# Patient Record
Sex: Female | Born: 1935 | Race: White | Hispanic: No | Marital: Married | Smoking: Never smoker
Health system: Southern US, Community
[De-identification: ages and names within clinical notes are randomized; demographics above are authoritative.]

## PROBLEM LIST (undated history)

## (undated) DIAGNOSIS — I10 Essential (primary) hypertension: Secondary | ICD-10-CM

## (undated) DIAGNOSIS — M1990 Unspecified osteoarthritis, unspecified site: Secondary | ICD-10-CM

---

## 1997-12-18 ENCOUNTER — Ambulatory Visit (HOSPITAL_COMMUNITY): Admission: RE | Admit: 1997-12-18 | Discharge: 1997-12-18 | Payer: Self-pay | Admitting: Family Medicine

## 1997-12-27 ENCOUNTER — Encounter: Payer: Self-pay | Admitting: Family Medicine

## 1997-12-27 ENCOUNTER — Ambulatory Visit (HOSPITAL_COMMUNITY): Admission: RE | Admit: 1997-12-27 | Discharge: 1997-12-27 | Payer: Self-pay | Admitting: Family Medicine

## 1999-01-09 ENCOUNTER — Ambulatory Visit (HOSPITAL_COMMUNITY): Admission: RE | Admit: 1999-01-09 | Discharge: 1999-01-09 | Payer: Self-pay | Admitting: Family Medicine

## 2000-02-26 ENCOUNTER — Ambulatory Visit (HOSPITAL_COMMUNITY): Admission: RE | Admit: 2000-02-26 | Discharge: 2000-02-26 | Payer: Self-pay | Admitting: Family Medicine

## 2000-03-08 ENCOUNTER — Encounter: Admission: RE | Admit: 2000-03-08 | Discharge: 2000-03-08 | Payer: Self-pay | Admitting: Family Medicine

## 2000-03-08 ENCOUNTER — Encounter: Payer: Self-pay | Admitting: Family Medicine

## 2000-11-25 ENCOUNTER — Encounter: Payer: Self-pay | Admitting: Family Medicine

## 2000-11-25 ENCOUNTER — Encounter: Admission: RE | Admit: 2000-11-25 | Discharge: 2000-11-25 | Payer: Self-pay | Admitting: Family Medicine

## 2001-03-01 ENCOUNTER — Ambulatory Visit (HOSPITAL_COMMUNITY): Admission: RE | Admit: 2001-03-01 | Discharge: 2001-03-01 | Payer: Self-pay | Admitting: Family Medicine

## 2001-04-04 ENCOUNTER — Encounter: Admission: RE | Admit: 2001-04-04 | Discharge: 2001-04-04 | Payer: Self-pay | Admitting: Family Medicine

## 2001-04-04 ENCOUNTER — Encounter: Payer: Self-pay | Admitting: Family Medicine

## 2002-03-02 ENCOUNTER — Ambulatory Visit (HOSPITAL_COMMUNITY): Admission: RE | Admit: 2002-03-02 | Discharge: 2002-03-02 | Payer: Self-pay | Admitting: Family Medicine

## 2002-03-02 ENCOUNTER — Encounter: Payer: Self-pay | Admitting: Family Medicine

## 2002-03-08 ENCOUNTER — Encounter: Admission: RE | Admit: 2002-03-08 | Discharge: 2002-04-06 | Payer: Self-pay | Admitting: Orthopaedic Surgery

## 2003-06-05 ENCOUNTER — Inpatient Hospital Stay (HOSPITAL_COMMUNITY): Admission: RE | Admit: 2003-06-05 | Discharge: 2003-06-08 | Payer: Self-pay | Admitting: Orthopedic Surgery

## 2003-06-12 ENCOUNTER — Ambulatory Visit (HOSPITAL_COMMUNITY): Admission: RE | Admit: 2003-06-12 | Discharge: 2003-06-12 | Payer: Self-pay | Admitting: Orthopedic Surgery

## 2004-01-21 ENCOUNTER — Encounter: Admission: RE | Admit: 2004-01-21 | Discharge: 2004-01-21 | Payer: Self-pay | Admitting: Family Medicine

## 2004-05-10 ENCOUNTER — Encounter: Admission: RE | Admit: 2004-05-10 | Discharge: 2004-05-10 | Payer: Self-pay | Admitting: Orthopaedic Surgery

## 2005-06-18 ENCOUNTER — Ambulatory Visit (HOSPITAL_COMMUNITY): Admission: RE | Admit: 2005-06-18 | Discharge: 2005-06-18 | Payer: Self-pay | Admitting: Family Medicine

## 2005-11-18 ENCOUNTER — Encounter: Admission: RE | Admit: 2005-11-18 | Discharge: 2005-11-18 | Payer: Self-pay | Admitting: Family Medicine

## 2008-10-16 ENCOUNTER — Encounter: Admission: RE | Admit: 2008-10-16 | Discharge: 2008-10-16 | Payer: Self-pay | Admitting: Obstetrics and Gynecology

## 2010-02-02 ENCOUNTER — Encounter: Payer: Self-pay | Admitting: Family Medicine

## 2010-05-30 NOTE — H&P (Signed)
Megan Harmon, Megan Harmon                       ACCOUNT NO.:  [id number]   MEDICAL RECORD NO.:  [id number]                   PATIENT TYPE:  INP   LOCATION:  0452                                 FACILITY:  [facility]   PHYSICIAN:  Madlyn Frankel. Charlann Boxer, M.D.               DATE OF BIRTH:  03/25/1932   DATE OF ADMISSION:  06/05/2003  DATE OF DISCHARGE:                                HISTORY & PHYSICAL   CHIEF COMPLAINT:  Left knee pain.   HISTORY OF PRESENT ILLNESS:  Ms. Megan Harmon is a pleasant 77 year old female  who had seen Dr. Charlann Boxer for evaluation of bilateral knee. She reports a long  standing history of arthritis involving multiple joints.  There was some  question of whether she had a diagnosis of rheumatoid arthritis; however,  she has never been treated medically for this.  She reported the recent  onset of increasing knee pain over the last two months, she noted swelling  behind her knee and increasing pain into the hip and to the ankle.  The  right knee though with constant pain has not increased in severity and is  tolerable. She complains of pain medially in the left knee and had some  popping and clicking type sensations. The pain in her knee has progressed to  the point where even though she has returned to work and tolerates activity  for a little while she ends of having enough pain that it causes her to be  tearful returning from work.  She has tried conservative treatment including  Celebrex, Aleve, Tylenol arthritis which does not provide a relief for her  knee whatsoever.   X-rays:  Radiographs in the office revealed significant end-stage medial  compartment degenerative changes in the left knee with joint space  preservation laterally with osteophyte formation both in the lateral  compartment and the patellofemoral compartment. Upon these findings, Dr.  Charlann Boxer feels it was best to proceed with a left total knee arthroplasty. The  patient agrees. The risks and benefits of the  surgery were discussed with  the patient and the patient wishes to proceed.   PAST MEDICAL HISTORY:  Hypertension and hypothyroidism.   PAST SURGICAL HISTORY:  Cholecystectomy, hysterectomy, bilateral feet  surgery, bilateral shoulder surgery and appendectomy.   MEDICATIONS:  1. Levothyroxine 0.2 mg 1 p.o. q.d.  2. Lotrel 10/20 mg 1 p.o. q.d.   ALLERGIES:  No known drug allergies.   SOCIAL HISTORY:  The patient denies any tobacco or alcohol use, lives in a  one story house with two steps entering the house, is married and her  grandson and her husband will be taking care of her after surgery.   FAMILY HISTORY:  Unremarkable.   REVIEW OF SYMPTOMS:  GENERAL:  Denies fevers, chills, night sweats, bleeding  tendencies. CNS:  Denies blurred or double vision, seizures, headaches,  paralysis.  RESPIRATORY:  Denies shortness of breath, productive cough,  hemoptysis.  CARDIOVASCULAR:  Denies chest pain, angina or orthopnea.  GI:  Positive consultation.  Denies nausea, vomiting, diarrhea, melena or bloody  stools. GU:  Positive frequency, positive nocturia.  Denies dysuria,  hematuria or discharge. MUSCULOSKELETAL:  Pertinent to HPI.   PHYSICAL EXAMINATION:  VITAL SIGNS:  Blood pressure 138/75, pulse 80,  respirations 16.  GENERAL:  Well-developed, well-nourished, 77 year old female.  HEENT:  Normocephalic, atraumatic. Pupils equal round and reactive to light.  NECK:  Supple, no carotid bruits.  CHEST:  Clear to auscultation bilaterally, no wheezes or crackles.  HEART:  Regular rate and rhythm, no murmurs, rubs or gallops.  ABDOMEN:  Soft, nontender, nondistended, positive bowel sounds x4.  EXTREMITIES:  The patient walks with an antalgic gait favoring the left  lower extremity.  Examination of the left knee reveals painful range of  motion with near full extension and flexion to 110 degrees with pain.  She  does have an obvious cyst with fluid collection and __________ on the left.   She is neurovascularly intact distally.  SKIN:  No rashes or lesions.   X-ray reveals end-stage osteoarthritis in the medial compartment and  osteophyte formation laterally and in the patellofemoral joint.   IMPRESSION:  1. Osteoarthritis left knee.  2. Hypertension.  3. Hypothyroidism.   PLAN:  The patient will be admitted to [facility] on Jun 05, 2003  and undergo a left total knee arthroplasty by Madlyn Frankel. Charlann Boxer, M.D.     Clarene Reamer, P.A.-C.                   Madlyn Frankel Charlann Boxer, M.D.    SW/MEDQ  D:  06/06/2003  T:  06/06/2003  Job:  [id number]

## 2010-05-30 NOTE — Op Note (Signed)
Megan Harmon, Megan Harmon                       ACCOUNT NO.:  [id number]   MEDICAL RECORD NO.:  [id number]                   PATIENT TYPE:  INP   LOCATION:  X004                                 FACILITY:  [facility]   PHYSICIAN:  Madlyn Frankel. Charlann Boxer, M.D.               DATE OF BIRTH:  03-12-1932   DATE OF PROCEDURE:  06/05/2003  DATE OF DISCHARGE:                                 OPERATIVE REPORT   PREOPERATIVE DIAGNOSES:  End-stage left knee osteoarthritis.   POSTOPERATIVE DIAGNOSES:  End-stage left knee osteoarthritis.   PROCEDURE:  Left total knee replacement.   COMPONENTS USED:  [facility] size 3 femur, size 3 cobalt  chromium polished tray with a size 15 poly, posterior stabilized 38 mm  patella.   SURGEON:  Madlyn Frankel. Charlann Boxer, M.D.   ASSISTANT:  Clarene Reamer, P.A.-C.   ANESTHESIA:  General.   ESTIMATED BLOOD LOSS:  150   TOURNIQUET TIME:  90 minutes at 300 mmHg.   COMPLICATIONS:  None apparent.   DRAINS:  Drains x1.   INDICATIONS FOR PROCEDURE:  Ms. Pucci is a 77 year old female who I have  been following for left knee degenerative changes both  clinically and  radiographically. She has failed conservative measures including injections  and oral antiinflammatories.  After reviewing the risks and benefits of left  total knee arthroplasty, she consents for the above procedure.   DESCRIPTION OF PROCEDURE:  The patient was brought to the operative theatre,  once adequate anesthesia and preoperative antibiotics were administered, the  patient was positioned supine with a proximal thigh tourniquet placed on the  left thigh. The left lower extremity was then prepped and draped in a  sterile fashion. A midline incision was made followed by medial parapatellar  arthrotomy.  Knee exposure revealed significant degenerative changes  tricompartmentally but most impressively medially. Following exposure of the  knee and brief __________osteophytes, the intramedullary guide  was placed on  the femur and 10 mm of bone were resected and found to be __________ across  the distal femur. At this point, a sizing jig was placed.  Initially it was  noted that the size 4 would prevent the notching of the anterior referencing  system.  With this size 4 in place, four size 4 anterior chamfer and  posterior chamfer cuts were made.  A trial femur was placed.  When the trial  femur was placed, it seemed to fit nicely on the medial side of the femur;  however, there seemed to be some lateral overhang.  I was a little worried  about the fact that femur seemed a little bit and I was going to keep this  in mind throughout the case. At this point, we directed attention to the  tibia.  Based on the cut surface of the tibia, I initially took 10 mm of  bone off of the medial side. This was done with an extramedullary device  passing through the medial third of the tibial tubercle.  After this cut, I  then evaluated an extension space and flexion space, I felt that we needed  to resect more bone. For this reason, I resected 4 more millimeters in order  to just prevent having to recut the tibia.  This would allow for further gap  in both extension and flexion symmetrically. With this made, the size 4  femur was placed followed by a 3 tibial size with a 10 mm poly and the knee  came to full hyperextension and we jumped up to a size 15 poly which gave [place]  excellent extension and stability. At this point, attention was directed to  patella preparation. The patella depth initially measured 22 mm. We resected  down to 13 mm and placed a 38 mm patella. At this point is when I first  noted the patella tracking seemed to be a little off. I was worried about  the lateral overhang of the femoral prosthesis.  At this point, I made a  decision to recut the femur into a size 3. Based on the position of the  initial pinning, what this basically amounted to was resecting probably 1  more millimeter  of bone anteriorly at 1 mm posterior and not changing  anything else.  The size 3 cutting block was placed and pinned into  position. Based on the concern for notching, I used the nonslotted version  noncaptured positioning so could feather the anterior cortex.  Following  this, I made the posterior cut and revisited the chamfer cuts. At this  point, a trial reduction was carried out to a size 3 femur, size 3 tibia and  a 50 mm polyp.  At this point, the knee came to full extension and was very  stable in extension and flexion, the patella now tracks.  Indicating that  the patella maltracking was a size issue.  Based on the anterior cortex I  was happy with this position.   At this point with final components known, we went ahead and finished  preparation of the tibia with a marked rotation with the knee in full  extension.  Basically the center portion of the tibial tray passed through  the medial third of the tibial tubercle.  Following tibial preparation  having the final components brought onto the field, the knee was copiously  irrigated with normal saline solution. The cement was prepared and once the  cement was prepared, the tibial component was cemented into position, a size  3 cobalt chromium polished tray followed by placement of a trial cruciate  retaining poly and the final size 3 tibial component was then cemented into  position. The knee was brought into full extension to allow the cement to  cure while the patella was cemented into position. Once the cement had  cured, excessive cement was removed. The knee was subluxated anteriorly  again to allow for debridement of any further cement plus placing the final  15 mm posterior stabilized poly ethylene liner. This polyethylene liner was  placed and the knee reduced. The knee was very stable in extension and flexion with good patellar tracking without thumb pressure.  At this point,  the knee was copiously irrigated with normal  saline solution. The tourniquet  was let down, the extensor mechanism was reapproximated using #1 PDS.  The  remainder of the wound was closed in layers with a running 4-0 Monocryl. The  knee was then cleaned, dried and  dressed sterilely with Steri-Strips,  dressings, sponges and bulky Jones dressing.  The patient tolerated the  procedure well without complications and was extubated and transferred to  the recovery room.                                               Madlyn Frankel Charlann Boxer, M.D.    MDO/MEDQ  D:  06/05/2003  T:  06/05/2003  Job:  [id number]

## 2010-05-30 NOTE — Discharge Summary (Signed)
[name], Megan Harmon                       ACCOUNT NO.:  [id number]   MEDICAL RECORD NO.:  [id number]                   PATIENT TYPE:  INP   LOCATION:  0452                                 FACILITY:  [facility]   PHYSICIAN:  Madlyn Frankel. Charlann Boxer, M.D.               DATE OF BIRTH:  [birth year]   DATE OF ADMISSION:  06/05/2003  DATE OF DISCHARGE:  06/08/2003                                 DISCHARGE SUMMARY   ADMISSION DIAGNOSES:  1. Osteoarthritis of the left knee.  2. Hypertension.  3. Hypothyroidism.   DISCHARGE DIAGNOSES:  1. Osteoarthritis of the left knee, status post left total knee     arthroplasty.  2. Hypertension.  3. Hypothyroidism.  4. Postoperative hemorrhagic anemia, stable at the time of discharge.   PROCEDURE:  The patient was taken to the operating room on Jun 05, 2003 and  underwent a left total knee arthroplasty.  Surgeon was Dr. Durene Romans and  assistant was Clarene Reamer, PA-C.  The surgery was done under general  anesthesia.  A Hemovac drain x1 was placed at the time of surgery.   CONSULTS:  1. Physical therapy.  2. Occupational therapy.  3. Social work.  4. Case management.   BRIEF HISTORY:  Patient is a pleasant 77 year old female who has seen Dr.  Charlann Boxer for evaluation of her bilateral knees.  She has a longstanding history  of arthritis involving multiple joints.  She has a recent onset of  increasing knee pain over the last two months.  She noted swelling behind  her knee and increasing pain into the hip and into the ankle.  The right  knee, although with constant pain, has not increased with severity and is  tolerable.  She complains of pain medially in the left knee and has some  popping and clicking-type sensations.  The pain in her knee has progressed  to the point where even though she has returned to work and is tolerating  activities for a little while, she ends up having enough pain that causes  her to be careful returning from work.  She has  tried conservative  management, including Celebrex, Aleve, Tylenol Arthritis, but this does not  provide relief of her knee whatsoever.   Radiographs in the office revealed significant end-stage medial compartment  degenerative changes in the left knee with joint space preservation  laterally with osteophyte formation in both the lateral compartment and  patellofemoral joint.  Upon these findings, Dr. Charlann Boxer felt it was best to  proceed with a left total knee arthroplasty.  The risks and benefits of the  surgery were discussed with the patient, and the patient wished to proceed.   LABORATORY DATA:  A CBC on admission showed a hemoglobin of 13.4, hematocrit  39.9, white blood cell count 18.5, red blood cell count 4.320.  H&H was  performed throughout the hospital stay.  Hemoglobin and hematocrit did  decline to 10.2  and 30.7 on May 26; however, on May 27 at the time of  discharge, they were stable at 10.3 and 30.6.  Differential on admission was  within normal limits.  Coagulation studies on admission were all within  normal limits.  PT/INR at the time of discharge were 15.3 and 1.3  respectively on Coumadin therapy.   Routine chemistry on admission showed the glucose high at 109 and BUN high  at 25.  Serial chemistries were followed throughout the hospital stay.  The  glucose ranged from a low of 105 on May 27, the day of discharge, to a high  of 138 on May 26.   BUN fell to 5 on May 26 and returned to normal at 7 on May 27.  Urinalysis  on admission showed a cloudy appearance with trace leukocyte esterase.  The  follow-up urinalysis showed a small amount of leukocyte esterase on May 24.  Patient's blood type is B positive with antibody screen negative.   EKG at the time of admission showed a normal sinus rhythm with low voltage,  QRS, and borderline EKG.   Preop chest x-ray revealed no acute cardiopulmonary findings.   HOSPITAL COURSE:  The patient was admitted to [facility] on Jun 05, 2003 and taken to the operating room.  She underwent the above-stated  procedure without complication.  The patient tolerated the procedure well  and was allowed to return to the recovery room and then to the orthopedic  floor to obtain postoperative care.  A Hemovac drain placed at the time of  surgery was discontinued on postop day #1.  The patient progressed with  physical therapy.  On postop day #2, the patient was weaned from PCA  analgesia to p.o. pain medicine.  She progressed well with physical therapy,  meeting all physical therapy goals by postop day #3, and was ready for  discharge on Jun 08, 2003, being postoperative day  #3.   DISPOSITION:  The patient was discharged home on Jun 08, 2003.   DISCHARGE MEDICATIONS:  1. Vicodin 1-2 p.o. q.4-6h. p.r.n. pain.  2. Robaxin 500 mg 1 p.o. q.6-8h. p.r.n. spasm.  3. Coumadin 7.5 mg 1 p.o. at 6 p.m. daily.   DIET:  As tolerated.   ACTIVITY:  Patient is weightbearing as tolerated to the left lower  extremity.  [facility] for home care.   WOUND CARE:  Patient is to have daily dressing changes performed until no  drainage.  She may shower when no drainage.   FOLLOW UP:  Patient is to follow up with Dr. Charlann Boxer two weeks from the date of  surgery.  Call the office for an appointment at [phone].   CONDITION ON DISCHARGE:  Stable.     Clarene Reamer, P.A.-C.                   Madlyn Frankel Charlann Boxer, M.D.    SW/MEDQ  D:  07/03/2003  T:  07/04/2003  Job:  [id number]

## 2010-11-20 ENCOUNTER — Emergency Department (HOSPITAL_COMMUNITY): Payer: No Typology Code available for payment source

## 2010-11-20 ENCOUNTER — Emergency Department (HOSPITAL_COMMUNITY)
Admission: EM | Admit: 2010-11-20 | Discharge: 2010-11-20 | Disposition: A | Discharge status: Home / Self Care | Payer: No Typology Code available for payment source | Attending: Emergency Medicine | Admitting: Emergency Medicine

## 2010-11-20 DIAGNOSIS — R079 Chest pain, unspecified: Secondary | ICD-10-CM | POA: Insufficient documentation

## 2010-11-20 DIAGNOSIS — R109 Unspecified abdominal pain: Secondary | ICD-10-CM | POA: Insufficient documentation

## 2010-11-20 DIAGNOSIS — S0990XA Unspecified injury of head, initial encounter: Secondary | ICD-10-CM | POA: Insufficient documentation

## 2010-11-20 DIAGNOSIS — IMO0002 Reserved for concepts with insufficient information to code with codable children: Secondary | ICD-10-CM | POA: Insufficient documentation

## 2010-11-20 DIAGNOSIS — R51 Headache: Secondary | ICD-10-CM | POA: Insufficient documentation

## 2010-11-20 DIAGNOSIS — M549 Dorsalgia, unspecified: Secondary | ICD-10-CM | POA: Insufficient documentation

## 2010-11-20 DIAGNOSIS — M542 Cervicalgia: Secondary | ICD-10-CM | POA: Insufficient documentation

## 2010-11-20 DIAGNOSIS — I10 Essential (primary) hypertension: Secondary | ICD-10-CM | POA: Insufficient documentation

## 2010-11-20 DIAGNOSIS — T148XXA Other injury of unspecified body region, initial encounter: Secondary | ICD-10-CM | POA: Insufficient documentation

## 2010-11-20 HISTORY — DX: Essential (primary) hypertension: I10

## 2010-11-20 HISTORY — DX: Unspecified osteoarthritis, unspecified site: M19.90

## 2010-11-20 LAB — CBC
HCT: 42.4 % (ref 36.0–46.0)
Hemoglobin: 14.2 g/dL (ref 12.0–15.0)
MCH: 31.6 pg (ref 26.0–34.0)
MCHC: 33.5 g/dL (ref 30.0–36.0)
MCV: 94.4 fL (ref 78.0–100.0)
Platelets: 244 10*3/uL (ref 150–400)
RBC: 4.49 MIL/uL (ref 3.87–5.11)
RDW: 13.8 % (ref 11.5–15.5)
WBC: 8.1 10*3/uL (ref 4.0–10.5)

## 2010-11-20 LAB — BASIC METABOLIC PANEL
BUN: 11 mg/dL (ref 6–23)
CO2: 29 mEq/L (ref 19–32)
Calcium: 10.5 mg/dL (ref 8.4–10.5)
Chloride: 104 mEq/L (ref 96–112)
Creatinine, Ser: 0.73 mg/dL (ref 0.50–1.10)
GFR calc Af Amer: >90 mL/min (ref >90)
GFR calc non Af Amer: 81 mL/min — ABNORMAL LOW (ref >90)
Glucose, Bld: 124 mg/dL — ABNORMAL HIGH (ref 70–99)
Potassium: 5.1 mEq/L (ref 3.5–5.1)
Sodium: 144 mEq/L (ref 135–145)

## 2010-11-20 MED ORDER — MORPHINE SULFATE 4 MG/ML IJ SOLN
4.0000 mg | Freq: Once | INTRAMUSCULAR | Status: AC
  Administered 2010-11-20: 4 mg via INTRAVENOUS
  Filled 2010-11-20: qty 1

## 2010-11-20 MED ORDER — LORAZEPAM 2 MG/ML IJ SOLN
1.0000 mg | Freq: Once | INTRAMUSCULAR | Status: AC
  Administered 2010-11-20: 1 mg via INTRAVENOUS
  Filled 2010-11-20: qty 1

## 2010-11-20 MED ORDER — IOHEXOL 300 MG/ML  SOLN: 80.0000 mL | Freq: Once | INTRAMUSCULAR | Status: DC | PRN

## 2010-11-20 MED ORDER — SODIUM CHLORIDE 0.9 % IV BOLUS (SEPSIS)
500.0000 mL | Freq: Once | INTRAVENOUS | Status: AC
  Administered 2010-11-20: 500 mL via INTRAVENOUS
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
## 2010-11-20 NOTE — Progress Notes (Signed)
Spiritual Care   Visited with patient and provided emotional support to patient.     Thornell Sartorius, chaplain

## 2010-11-20 NOTE — ED Notes (Signed)
Transferred to Ct scan
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
## 2013-04-09 ENCOUNTER — Emergency Department (HOSPITAL_COMMUNITY): Payer: Medicare Other

## 2013-04-09 ENCOUNTER — Encounter (HOSPITAL_COMMUNITY): Payer: Self-pay | Admitting: Emergency Medicine

## 2013-04-09 ENCOUNTER — Emergency Department (HOSPITAL_COMMUNITY)
Admission: EM | Admit: 2013-04-09 | Discharge: 2013-04-09 | Disposition: A | Discharge status: Home / Self Care | Payer: Medicare Other | Attending: Emergency Medicine | Admitting: Emergency Medicine

## 2013-04-09 DIAGNOSIS — Y929 Unspecified place or not applicable: Secondary | ICD-10-CM | POA: Insufficient documentation

## 2013-04-09 DIAGNOSIS — Y9389 Activity, other specified: Secondary | ICD-10-CM | POA: Insufficient documentation

## 2013-04-09 DIAGNOSIS — IMO0002 Reserved for concepts with insufficient information to code with codable children: Secondary | ICD-10-CM | POA: Insufficient documentation

## 2013-04-09 DIAGNOSIS — I10 Essential (primary) hypertension: Secondary | ICD-10-CM | POA: Insufficient documentation

## 2013-04-09 DIAGNOSIS — Z79899 Other long term (current) drug therapy: Secondary | ICD-10-CM | POA: Insufficient documentation

## 2013-04-09 DIAGNOSIS — S63502A Unspecified sprain of left wrist, initial encounter: Secondary | ICD-10-CM

## 2013-04-09 DIAGNOSIS — S63509A Unspecified sprain of unspecified wrist, initial encounter: Secondary | ICD-10-CM | POA: Insufficient documentation

## 2013-04-09 DIAGNOSIS — M129 Arthropathy, unspecified: Secondary | ICD-10-CM | POA: Insufficient documentation
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
## 2013-04-09 NOTE — Discharge Instructions (Signed)
Take your vicodin as prescribed for severe pain.   Do not drive within four hours of taking this medication (may cause drowsiness or confusion).  Take low dose ibuprofen or aleve as well. Ice 3 times a day for 15-20 minutes.  Elevate when possible to decrease swelling and pain.  Follow up with Dr. Mina MarbleWeingold for recheck.  Return to the ER if your pain worsens or you have any other concerns.

## 2013-04-09 NOTE — Progress Notes (Signed)
Orthopedic Tech Progress Note Patient Details:  [facility]yl[facility] [place] Mar 19, 1932 [id number]  Ortho Devices Type of Ortho Device: Thumb velcro splint Ortho Device/Splint Location: lue Ortho Device/Splint Interventions: Application   Astoria Condon 04/09/2013, 7:16 PM

## 2013-04-09 NOTE — ED Notes (Signed)
Thumb spica is already placed. Will prepare for discharge
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
## 2013-04-12 NOTE — ED Provider Notes (Signed)
Medical screening examination/treatment/procedure(s) were performed by non-physician practitioner and as supervising physician I was immediately available for consultation/collaboration.     Zylan Almquist, MD 04/12/13 2354 

## 2015-10-28 ENCOUNTER — Ambulatory Visit
Admission: RE | Admit: 2015-10-28 | Discharge: 2015-10-28 | Disposition: A | Discharge status: Home / Self Care | Payer: Self-pay | Source: Ambulatory Visit | Attending: Physician Assistant | Admitting: Physician Assistant

## 2015-10-28 ENCOUNTER — Other Ambulatory Visit: Payer: Self-pay | Admitting: Physician Assistant

## 2015-10-28 DIAGNOSIS — R109 Unspecified abdominal pain: Secondary | ICD-10-CM

## 2016-04-01 ENCOUNTER — Ambulatory Visit
Admission: RE | Admit: 2016-04-01 | Discharge: 2016-04-01 | Disposition: A | Discharge status: Home / Self Care | Payer: Medicare Other | Source: Ambulatory Visit | Attending: Family Medicine | Admitting: Family Medicine

## 2016-04-01 ENCOUNTER — Other Ambulatory Visit: Payer: Self-pay | Admitting: Family Medicine

## 2016-04-01 DIAGNOSIS — R52 Pain, unspecified: Secondary | ICD-10-CM

## 2016-04-01 DIAGNOSIS — R0789 Other chest pain: Secondary | ICD-10-CM

## 2016-04-02 ENCOUNTER — Other Ambulatory Visit: Payer: Self-pay | Admitting: Family Medicine

## 2016-04-02 DIAGNOSIS — R51 Headache: Secondary | ICD-10-CM

## 2016-04-02 DIAGNOSIS — R42 Dizziness and giddiness: Secondary | ICD-10-CM

## 2016-04-02 DIAGNOSIS — S0990XA Unspecified injury of head, initial encounter: Secondary | ICD-10-CM

## 2016-04-02 DIAGNOSIS — R519 Headache, unspecified: Secondary | ICD-10-CM

## 2016-04-07 ENCOUNTER — Other Ambulatory Visit: Payer: Medicare Other

## 2016-04-09 ENCOUNTER — Ambulatory Visit
Admission: RE | Admit: 2016-04-09 | Discharge: 2016-04-09 | Disposition: A | Discharge status: Home / Self Care | Payer: Medicare Other | Source: Ambulatory Visit | Attending: Family Medicine | Admitting: Family Medicine

## 2016-04-09 DIAGNOSIS — R42 Dizziness and giddiness: Secondary | ICD-10-CM

## 2016-04-09 DIAGNOSIS — R51 Headache: Secondary | ICD-10-CM

## 2016-04-09 DIAGNOSIS — S0990XA Unspecified injury of head, initial encounter: Secondary | ICD-10-CM

## 2016-04-09 DIAGNOSIS — R519 Headache, unspecified: Secondary | ICD-10-CM

## 2017-09-17 ENCOUNTER — Ambulatory Visit
Admission: RE | Admit: 2017-09-17 | Discharge: 2017-09-17 | Disposition: A | Discharge status: Home / Self Care | Payer: Medicare Other | Source: Ambulatory Visit | Attending: Family Medicine | Admitting: Family Medicine

## 2017-09-17 ENCOUNTER — Other Ambulatory Visit: Payer: Self-pay | Admitting: Family Medicine

## 2017-09-17 DIAGNOSIS — S0990XA Unspecified injury of head, initial encounter: Secondary | ICD-10-CM
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
## 2018-01-12 IMAGING — US US CAROTID DUPLEX BILAT
1 series · 13 of 24 positions shown · non-contrast
Comparison: None.

CLINICAL DATA: Dizziness.  Two episodes of syncope this year.

EXAM:
BILATERAL CAROTID DUPLEX ULTRASOUND
TECHNIQUE: Gray scale imaging, color Doppler and duplex ultrasound were
performed of bilateral carotid and vertebral arteries in the neck.

[Series 1: us carotid duplex bilat · 0.06mm/px · 13 of 45 slices shown]
[im 1/45]
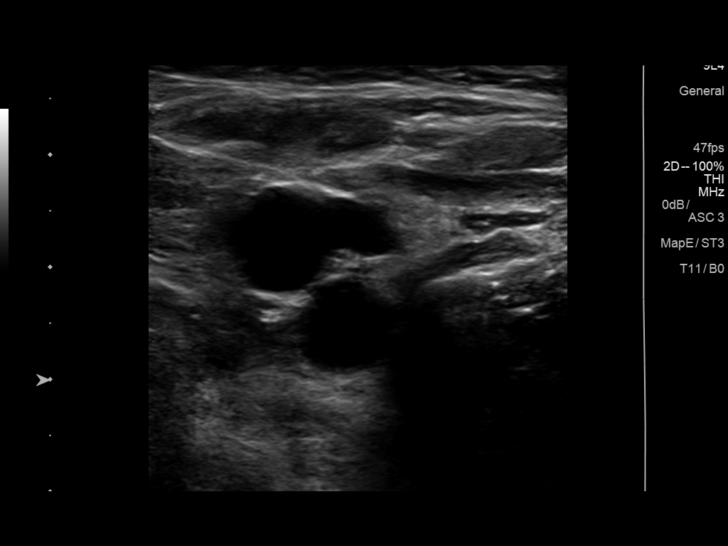
[im 4/45]
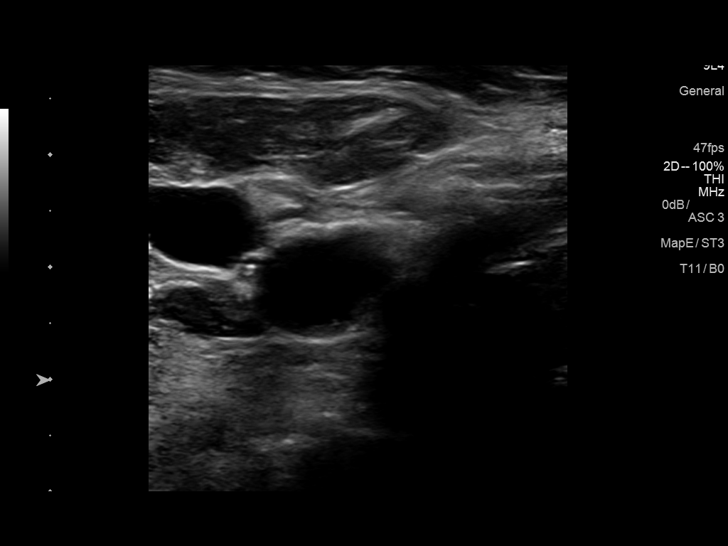
[im 8/45]
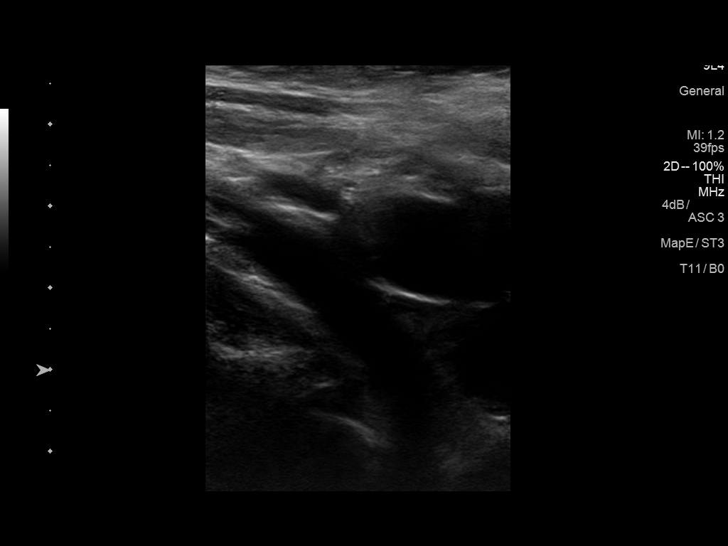
[im 12/45]
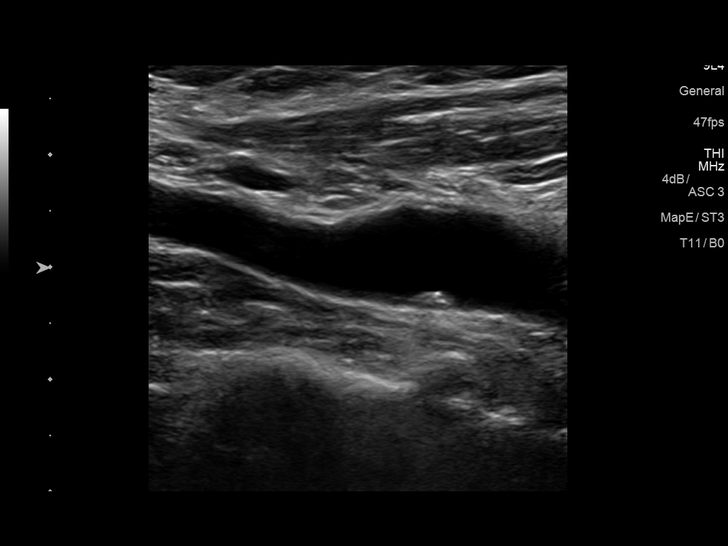
[im 16/45]
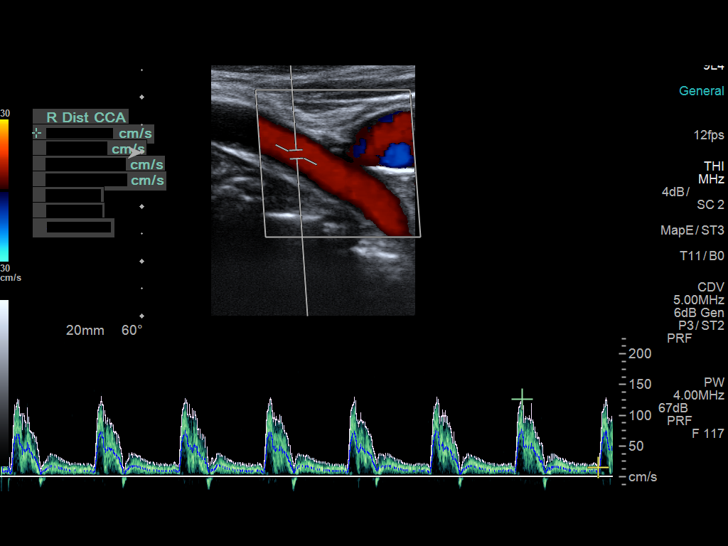
[im 20/45]
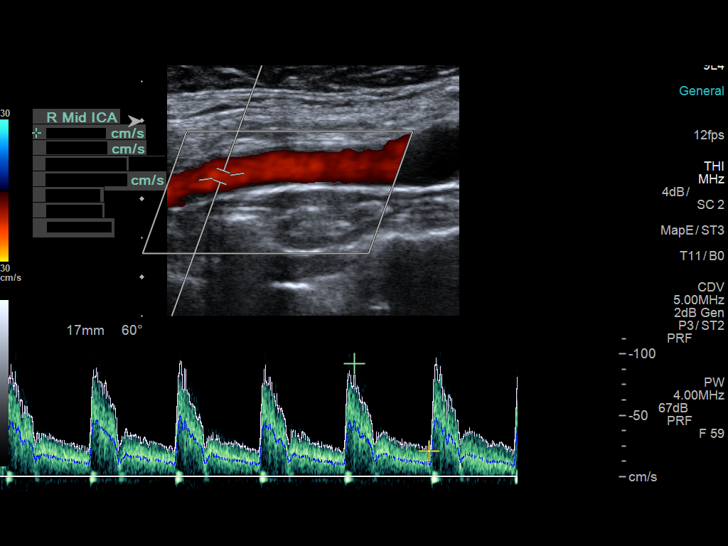
[im 23/45]
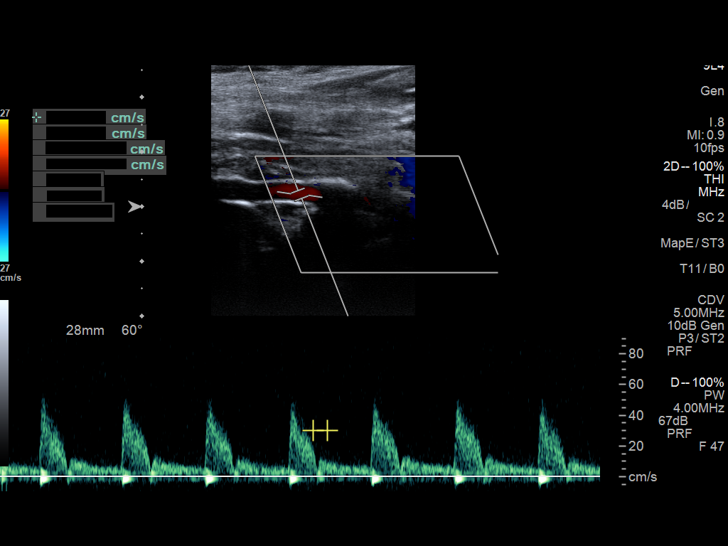
[im 25/45]
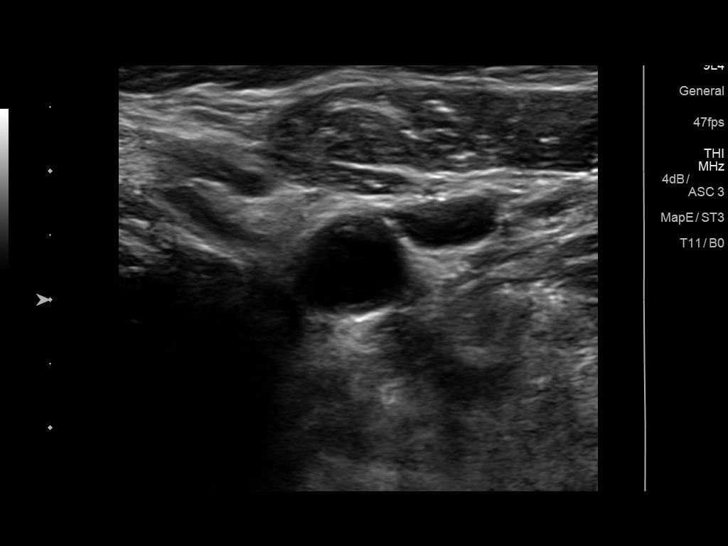
[im 29/45]
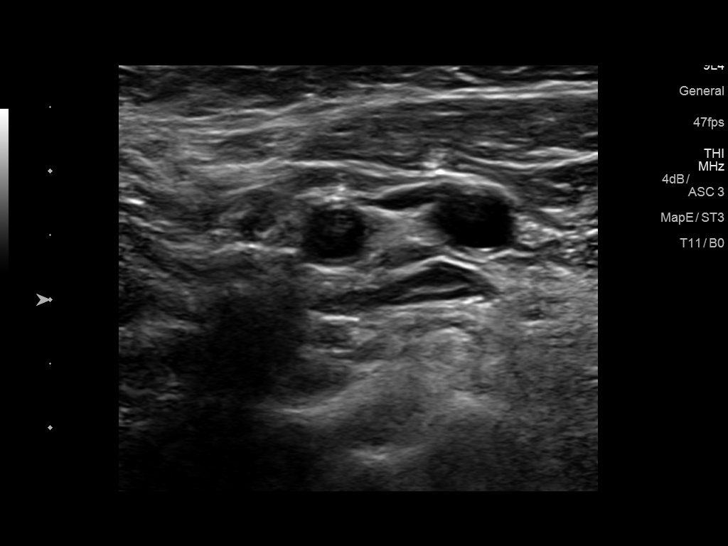
[im 33/45]
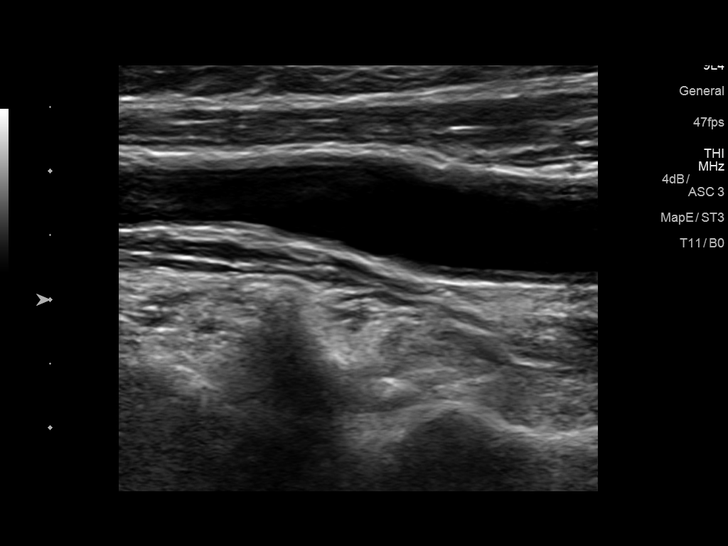
[im 37/45]
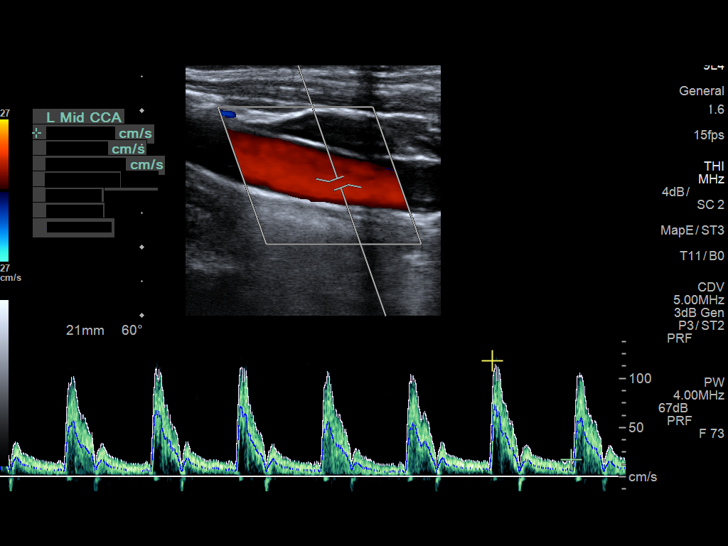
[im 41/45]
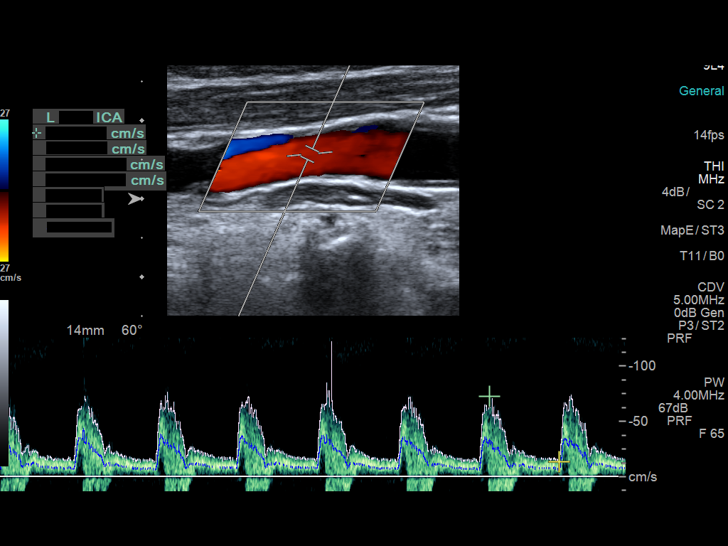
[im 45/45]
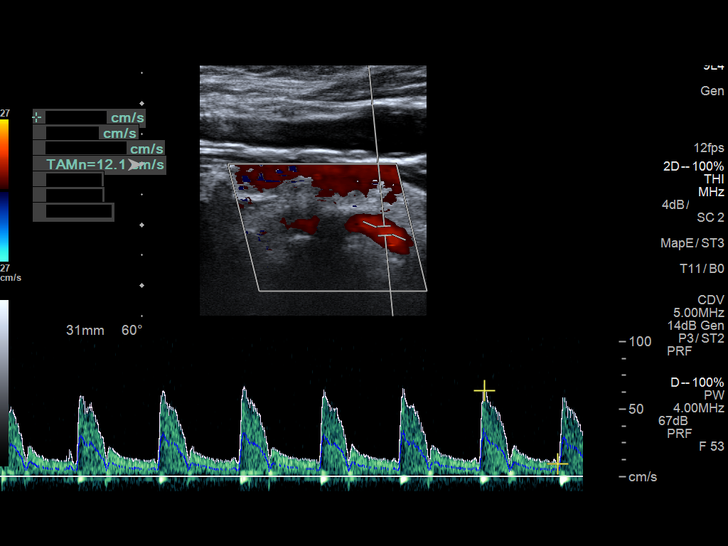

[13 of 24 positions shown; findings below may reference images not displayed]

FINDINGS: Criteria: Quantification of carotid stenosis is based on velocity
parameters that correlate the residual internal carotid diameter
with NASCET-based stenosis levels, using the diameter of the distal
internal carotid lumen as the denominator for stenosis measurement.

The following velocity measurements were obtained:

RIGHT

ICA:  109 cm/sec

CCA:  126 cm/sec

SYSTOLIC ICA/CCA RATIO:

DIASTOLIC ICA/CCA RATIO:

ECA:  115 cm/sec

LEFT

ICA:  100 cm/sec

CCA:  128 cm/sec

SYSTOLIC ICA/CCA RATIO:

DIASTOLIC ICA/CCA RATIO:

ECA:  150 cm/sec

RIGHT CAROTID ARTERY: Minimal calcified plaque in the bulb. Low
resistance internal carotid Doppler pattern.

RIGHT VERTEBRAL ARTERY:  Antegrade.

LEFT CAROTID ARTERY: Little if any plaque in the bulb. Low
resistance internal carotid Doppler pattern.

LEFT VERTEBRAL ARTERY:  Antegrade.
IMPRESSION: Less than 50% stenosis in the right and left internal carotid
arteries.

## 2018-10-10 ENCOUNTER — Ambulatory Visit
Admission: RE | Admit: 2018-10-10 | Discharge: 2018-10-10 | Disposition: A | Discharge status: Home / Self Care | Payer: Medicare Other | Source: Ambulatory Visit | Attending: Family Medicine | Admitting: Family Medicine

## 2018-10-10 ENCOUNTER — Other Ambulatory Visit: Payer: Self-pay | Admitting: Family Medicine

## 2018-10-10 DIAGNOSIS — M79671 Pain in right foot: Secondary | ICD-10-CM

## 2018-10-10 DIAGNOSIS — M25551 Pain in right hip: Secondary | ICD-10-CM

## 2020-01-22 ENCOUNTER — Other Ambulatory Visit: Payer: Self-pay | Admitting: Physician Assistant

## 2020-01-22 ENCOUNTER — Ambulatory Visit
Admission: RE | Admit: 2020-01-22 | Discharge: 2020-01-22 | Disposition: A | Discharge status: Home / Self Care | Payer: Medicare Other | Source: Ambulatory Visit | Attending: Physician Assistant | Admitting: Physician Assistant

## 2020-01-22 DIAGNOSIS — R0602 Shortness of breath: Secondary | ICD-10-CM

## 2020-01-22 DIAGNOSIS — R079 Chest pain, unspecified: Secondary | ICD-10-CM | POA: Diagnosis not present

## 2020-01-22 DIAGNOSIS — M1990 Unspecified osteoarthritis, unspecified site: Secondary | ICD-10-CM | POA: Diagnosis not present

## 2020-01-22 DIAGNOSIS — R52 Pain, unspecified: Secondary | ICD-10-CM

## 2020-01-22 DIAGNOSIS — M25512 Pain in left shoulder: Secondary | ICD-10-CM | POA: Diagnosis not present

## 2020-01-22 DIAGNOSIS — M19012 Primary osteoarthritis, left shoulder: Secondary | ICD-10-CM | POA: Diagnosis not present

## 2020-02-20 DIAGNOSIS — Z961 Presence of intraocular lens: Secondary | ICD-10-CM | POA: Diagnosis not present

## 2020-02-20 DIAGNOSIS — H40051 Ocular hypertension, right eye: Secondary | ICD-10-CM | POA: Diagnosis not present

## 2020-02-20 DIAGNOSIS — H04123 Dry eye syndrome of bilateral lacrimal glands: Secondary | ICD-10-CM | POA: Diagnosis not present

## 2020-02-20 DIAGNOSIS — H401123 Primary open-angle glaucoma, left eye, severe stage: Secondary | ICD-10-CM | POA: Diagnosis not present

## 2020-02-22 DIAGNOSIS — R0602 Shortness of breath: Secondary | ICD-10-CM | POA: Diagnosis not present

## 2020-02-22 DIAGNOSIS — E039 Hypothyroidism, unspecified: Secondary | ICD-10-CM | POA: Diagnosis not present

## 2020-03-14 DIAGNOSIS — M899 Disorder of bone, unspecified: Secondary | ICD-10-CM | POA: Diagnosis not present

## 2020-03-14 DIAGNOSIS — R7301 Impaired fasting glucose: Secondary | ICD-10-CM | POA: Diagnosis not present

## 2020-03-14 DIAGNOSIS — E039 Hypothyroidism, unspecified: Secondary | ICD-10-CM | POA: Diagnosis not present

## 2020-03-14 DIAGNOSIS — K219 Gastro-esophageal reflux disease without esophagitis: Secondary | ICD-10-CM | POA: Diagnosis not present

## 2020-03-14 DIAGNOSIS — H409 Unspecified glaucoma: Secondary | ICD-10-CM | POA: Diagnosis not present

## 2020-03-14 DIAGNOSIS — E782 Mixed hyperlipidemia: Secondary | ICD-10-CM | POA: Diagnosis not present

## 2020-03-14 DIAGNOSIS — I10 Essential (primary) hypertension: Secondary | ICD-10-CM | POA: Diagnosis not present

## 2020-03-14 DIAGNOSIS — M1990 Unspecified osteoarthritis, unspecified site: Secondary | ICD-10-CM | POA: Diagnosis not present

## 2020-03-14 DIAGNOSIS — Z0000 Encounter for general adult medical examination without abnormal findings: Secondary | ICD-10-CM | POA: Diagnosis not present

## 2020-04-18 DIAGNOSIS — H401123 Primary open-angle glaucoma, left eye, severe stage: Secondary | ICD-10-CM | POA: Diagnosis not present

## 2020-04-18 DIAGNOSIS — H40051 Ocular hypertension, right eye: Secondary | ICD-10-CM | POA: Diagnosis not present

## 2020-09-19 DIAGNOSIS — E039 Hypothyroidism, unspecified: Secondary | ICD-10-CM | POA: Diagnosis not present

## 2020-09-19 DIAGNOSIS — R7301 Impaired fasting glucose: Secondary | ICD-10-CM | POA: Diagnosis not present

## 2020-09-19 DIAGNOSIS — M1990 Unspecified osteoarthritis, unspecified site: Secondary | ICD-10-CM | POA: Diagnosis not present

## 2020-09-19 DIAGNOSIS — E782 Mixed hyperlipidemia: Secondary | ICD-10-CM | POA: Diagnosis not present

## 2020-09-19 DIAGNOSIS — I10 Essential (primary) hypertension: Secondary | ICD-10-CM | POA: Diagnosis not present

## 2020-09-19 DIAGNOSIS — Z23 Encounter for immunization: Secondary | ICD-10-CM | POA: Diagnosis not present

## 2021-01-29 DIAGNOSIS — H6123 Impacted cerumen, bilateral: Secondary | ICD-10-CM | POA: Diagnosis not present

## 2021-02-25 DIAGNOSIS — H40051 Ocular hypertension, right eye: Secondary | ICD-10-CM | POA: Diagnosis not present

## 2021-02-25 DIAGNOSIS — H401123 Primary open-angle glaucoma, left eye, severe stage: Secondary | ICD-10-CM | POA: Diagnosis not present

## 2021-02-25 DIAGNOSIS — Z961 Presence of intraocular lens: Secondary | ICD-10-CM | POA: Diagnosis not present

## 2021-02-25 DIAGNOSIS — H04123 Dry eye syndrome of bilateral lacrimal glands: Secondary | ICD-10-CM | POA: Diagnosis not present

## 2021-03-21 DIAGNOSIS — Z0000 Encounter for general adult medical examination without abnormal findings: Secondary | ICD-10-CM | POA: Diagnosis not present

## 2021-03-21 DIAGNOSIS — E039 Hypothyroidism, unspecified: Secondary | ICD-10-CM | POA: Diagnosis not present

## 2021-03-21 DIAGNOSIS — M899 Disorder of bone, unspecified: Secondary | ICD-10-CM | POA: Diagnosis not present

## 2021-03-21 DIAGNOSIS — K219 Gastro-esophageal reflux disease without esophagitis: Secondary | ICD-10-CM | POA: Diagnosis not present

## 2021-03-21 DIAGNOSIS — E782 Mixed hyperlipidemia: Secondary | ICD-10-CM | POA: Diagnosis not present

## 2021-03-21 DIAGNOSIS — M1990 Unspecified osteoarthritis, unspecified site: Secondary | ICD-10-CM | POA: Diagnosis not present

## 2021-03-21 DIAGNOSIS — I10 Essential (primary) hypertension: Secondary | ICD-10-CM | POA: Diagnosis not present

## 2021-03-21 DIAGNOSIS — H409 Unspecified glaucoma: Secondary | ICD-10-CM | POA: Diagnosis not present

## 2021-03-21 DIAGNOSIS — R7301 Impaired fasting glucose: Secondary | ICD-10-CM | POA: Diagnosis not present

## 2021-05-28 DIAGNOSIS — H04123 Dry eye syndrome of bilateral lacrimal glands: Secondary | ICD-10-CM | POA: Diagnosis not present

## 2021-05-28 DIAGNOSIS — Z961 Presence of intraocular lens: Secondary | ICD-10-CM | POA: Diagnosis not present

## 2021-05-28 DIAGNOSIS — H401123 Primary open-angle glaucoma, left eye, severe stage: Secondary | ICD-10-CM | POA: Diagnosis not present

## 2021-09-18 DIAGNOSIS — E039 Hypothyroidism, unspecified: Secondary | ICD-10-CM | POA: Diagnosis not present

## 2021-09-18 DIAGNOSIS — I10 Essential (primary) hypertension: Secondary | ICD-10-CM | POA: Diagnosis not present

## 2021-09-18 DIAGNOSIS — M1990 Unspecified osteoarthritis, unspecified site: Secondary | ICD-10-CM | POA: Diagnosis not present

## 2021-09-18 DIAGNOSIS — R7301 Impaired fasting glucose: Secondary | ICD-10-CM | POA: Diagnosis not present

## 2021-09-18 DIAGNOSIS — E782 Mixed hyperlipidemia: Secondary | ICD-10-CM | POA: Diagnosis not present

## 2021-09-18 DIAGNOSIS — Z23 Encounter for immunization: Secondary | ICD-10-CM | POA: Diagnosis not present

## 2021-09-18 DIAGNOSIS — R1013 Epigastric pain: Secondary | ICD-10-CM | POA: Diagnosis not present

## 2021-10-11 DIAGNOSIS — Z23 Encounter for immunization: Secondary | ICD-10-CM | POA: Diagnosis not present

## 2022-01-08 ENCOUNTER — Other Ambulatory Visit (HOSPITAL_COMMUNITY): Payer: Self-pay

## 2022-01-13 ENCOUNTER — Other Ambulatory Visit (HOSPITAL_COMMUNITY): Payer: Self-pay

## 2022-01-13 ENCOUNTER — Other Ambulatory Visit: Payer: Self-pay

## 2022-01-20 ENCOUNTER — Other Ambulatory Visit (HOSPITAL_COMMUNITY): Payer: Self-pay

## 2022-01-20 MED ORDER — SIMVASTATIN 20 MG PO TABS
20.0000 mg | ORAL_TABLET | Freq: Every evening | ORAL | 0 refills | Status: DC
  Filled 2022-01-20: qty 90, 90d supply, fill #0

## 2022-01-20 MED ORDER — BENAZEPRIL HCL 40 MG PO TABS
40.0000 mg | ORAL_TABLET | Freq: Every day | ORAL | 0 refills | Status: DC
  Filled 2022-01-20: qty 90, 90d supply, fill #0

## 2022-01-20 MED ORDER — MELOXICAM 15 MG PO TABS
15.0000 mg | ORAL_TABLET | Freq: Every day | ORAL | 0 refills | Status: DC
  Filled 2022-01-20: qty 90, 90d supply, fill #0

## 2022-01-20 MED ORDER — ALPRAZOLAM 0.5 MG PO TABS
0.2500 mg | ORAL_TABLET | Freq: Two times a day (BID) | ORAL | 0 refills | Status: DC | PRN
  Filled 2022-01-20: qty 60, 30d supply, fill #0

## 2022-01-20 MED ORDER — HYDROCHLOROTHIAZIDE 25 MG PO TABS
25.0000 mg | ORAL_TABLET | ORAL | 0 refills | Status: DC
  Filled 2022-01-20: qty 90, 90d supply, fill #0

## 2022-01-20 MED ORDER — OMEPRAZOLE 40 MG PO CPDR
DELAYED_RELEASE_CAPSULE | ORAL | 2 refills | Status: DC
  Filled 2022-01-20: qty 60, 30d supply, fill #0
  Filled 2022-08-12: qty 60, 30d supply, fill #1
  Filled 2022-10-12: qty 60, 30d supply, fill #2

## 2022-01-20 MED ORDER — FUROSEMIDE 20 MG PO TABS
20.0000 mg | ORAL_TABLET | Freq: Every day | ORAL | 0 refills | Status: DC
  Filled 2022-01-20: qty 90, 90d supply, fill #0

## 2022-01-20 MED ORDER — LEVOTHYROXINE SODIUM 150 MCG PO TABS
150.0000 ug | ORAL_TABLET | ORAL | 3 refills | Status: AC
  Filled 2022-01-20: qty 90, 90d supply, fill #0

## 2022-01-20 MED ORDER — CITALOPRAM HYDROBROMIDE 40 MG PO TABS
40.0000 mg | ORAL_TABLET | Freq: Every day | ORAL | 1 refills | Status: DC
  Filled 2022-01-20: qty 90, 90d supply, fill #0

## 2022-01-20 MED ORDER — OXYCODONE-ACETAMINOPHEN 5-325 MG PO TABS
1.0000 | ORAL_TABLET | Freq: Four times a day (QID) | ORAL | 0 refills | Status: DC
  Filled 2022-01-20: qty 120, 20d supply, fill #0

## 2022-01-21 ENCOUNTER — Other Ambulatory Visit (HOSPITAL_COMMUNITY): Payer: Self-pay

## 2022-01-22 ENCOUNTER — Other Ambulatory Visit (HOSPITAL_COMMUNITY): Payer: Self-pay

## 2022-01-23 ENCOUNTER — Other Ambulatory Visit (HOSPITAL_COMMUNITY): Payer: Self-pay

## 2022-01-28 DIAGNOSIS — H401123 Primary open-angle glaucoma, left eye, severe stage: Secondary | ICD-10-CM | POA: Diagnosis not present

## 2022-02-24 ENCOUNTER — Other Ambulatory Visit (HOSPITAL_COMMUNITY): Payer: Self-pay

## 2022-02-26 ENCOUNTER — Other Ambulatory Visit (HOSPITAL_COMMUNITY): Payer: Self-pay

## 2022-02-26 ENCOUNTER — Other Ambulatory Visit: Payer: Self-pay

## 2022-02-26 MED ORDER — OXYCODONE-ACETAMINOPHEN 5-325 MG PO TABS
1.0000 | ORAL_TABLET | Freq: Four times a day (QID) | ORAL | 0 refills | Status: DC | PRN
  Filled 2022-02-26: qty 120, 20d supply, fill #0

## 2022-02-26 MED ORDER — ALPRAZOLAM 0.5 MG PO TABS
0.2500 mg | ORAL_TABLET | Freq: Two times a day (BID) | ORAL | 0 refills | Status: DC | PRN
  Filled 2022-02-26: qty 60, 30d supply, fill #0

## 2022-02-27 ENCOUNTER — Other Ambulatory Visit (HOSPITAL_COMMUNITY): Payer: Self-pay

## 2022-03-03 ENCOUNTER — Other Ambulatory Visit (HOSPITAL_COMMUNITY): Payer: Self-pay

## 2022-03-27 ENCOUNTER — Other Ambulatory Visit (HOSPITAL_COMMUNITY): Payer: Self-pay

## 2022-03-27 DIAGNOSIS — E669 Obesity, unspecified: Secondary | ICD-10-CM | POA: Diagnosis not present

## 2022-03-27 DIAGNOSIS — Z0000 Encounter for general adult medical examination without abnormal findings: Secondary | ICD-10-CM | POA: Diagnosis not present

## 2022-03-27 DIAGNOSIS — E782 Mixed hyperlipidemia: Secondary | ICD-10-CM | POA: Diagnosis not present

## 2022-03-27 DIAGNOSIS — F411 Generalized anxiety disorder: Secondary | ICD-10-CM | POA: Diagnosis not present

## 2022-03-27 DIAGNOSIS — E039 Hypothyroidism, unspecified: Secondary | ICD-10-CM | POA: Diagnosis not present

## 2022-03-27 DIAGNOSIS — M899 Disorder of bone, unspecified: Secondary | ICD-10-CM | POA: Diagnosis not present

## 2022-03-27 DIAGNOSIS — K219 Gastro-esophageal reflux disease without esophagitis: Secondary | ICD-10-CM | POA: Diagnosis not present

## 2022-03-27 DIAGNOSIS — H409 Unspecified glaucoma: Secondary | ICD-10-CM | POA: Diagnosis not present

## 2022-03-27 DIAGNOSIS — F3341 Major depressive disorder, recurrent, in partial remission: Secondary | ICD-10-CM | POA: Diagnosis not present

## 2022-03-27 DIAGNOSIS — R7301 Impaired fasting glucose: Secondary | ICD-10-CM | POA: Diagnosis not present

## 2022-03-27 DIAGNOSIS — I10 Essential (primary) hypertension: Secondary | ICD-10-CM | POA: Diagnosis not present

## 2022-03-27 DIAGNOSIS — M1990 Unspecified osteoarthritis, unspecified site: Secondary | ICD-10-CM | POA: Diagnosis not present

## 2022-03-27 MED ORDER — OXYCODONE-ACETAMINOPHEN 7.5-325 MG PO TABS
1.0000 | ORAL_TABLET | Freq: Four times a day (QID) | ORAL | 0 refills | Status: DC | PRN
  Filled 2022-03-27: qty 120, 30d supply, fill #0

## 2022-04-27 DIAGNOSIS — N179 Acute kidney failure, unspecified: Secondary | ICD-10-CM | POA: Diagnosis not present

## 2022-04-29 ENCOUNTER — Other Ambulatory Visit (HOSPITAL_COMMUNITY): Payer: Self-pay

## 2022-04-29 MED ORDER — OXYCODONE-ACETAMINOPHEN 7.5-325 MG PO TABS
1.0000 | ORAL_TABLET | Freq: Four times a day (QID) | ORAL | 0 refills | Status: DC | PRN
  Filled 2022-04-29: qty 120, 30d supply, fill #0

## 2022-04-29 MED ORDER — ALPRAZOLAM 0.5 MG PO TABS
0.2500 mg | ORAL_TABLET | Freq: Two times a day (BID) | ORAL | 0 refills | Status: DC | PRN
  Filled 2022-04-29: qty 60, 30d supply, fill #0

## 2022-04-29 MED ORDER — FUROSEMIDE 20 MG PO TABS
20.0000 mg | ORAL_TABLET | Freq: Every day | ORAL | 1 refills | Status: DC | PRN
  Filled 2022-04-29: qty 90, 90d supply, fill #0
  Filled 2022-07-15 (×2): qty 90, 90d supply, fill #1

## 2022-04-30 ENCOUNTER — Other Ambulatory Visit: Payer: Self-pay

## 2022-06-10 ENCOUNTER — Other Ambulatory Visit: Payer: Self-pay

## 2022-06-10 ENCOUNTER — Other Ambulatory Visit (HOSPITAL_COMMUNITY): Payer: Self-pay

## 2022-06-10 MED ORDER — OXYCODONE-ACETAMINOPHEN 7.5-325 MG PO TABS
1.0000 | ORAL_TABLET | Freq: Four times a day (QID) | ORAL | 0 refills | Status: DC | PRN
  Filled 2022-06-10: qty 120, 30d supply, fill #0

## 2022-06-11 ENCOUNTER — Other Ambulatory Visit: Payer: Self-pay

## 2022-06-11 MED ORDER — ALPRAZOLAM 0.5 MG PO TABS
0.2500 mg | ORAL_TABLET | Freq: Two times a day (BID) | ORAL | 0 refills | Status: DC | PRN
  Filled 2022-06-11: qty 60, 30d supply, fill #0

## 2022-06-24 DIAGNOSIS — I10 Essential (primary) hypertension: Secondary | ICD-10-CM | POA: Diagnosis not present

## 2022-06-24 DIAGNOSIS — R609 Edema, unspecified: Secondary | ICD-10-CM | POA: Diagnosis not present

## 2022-06-24 DIAGNOSIS — Z993 Dependence on wheelchair: Secondary | ICD-10-CM | POA: Diagnosis not present

## 2022-07-13 ENCOUNTER — Other Ambulatory Visit (HOSPITAL_COMMUNITY): Payer: Self-pay

## 2022-07-13 MED ORDER — CITALOPRAM HYDROBROMIDE 40 MG PO TABS
40.0000 mg | ORAL_TABLET | Freq: Every day | ORAL | 1 refills | Status: DC
  Filled 2022-07-13: qty 90, 90d supply, fill #0
  Filled 2022-10-12: qty 90, 90d supply, fill #1

## 2022-07-13 MED ORDER — SIMVASTATIN 20 MG PO TABS
20.0000 mg | ORAL_TABLET | Freq: Every evening | ORAL | 1 refills | Status: DC
  Filled 2022-07-13: qty 90, 90d supply, fill #0
  Filled 2022-10-12: qty 90, 90d supply, fill #1

## 2022-07-13 MED ORDER — HYDROCHLOROTHIAZIDE 25 MG PO TABS
25.0000 mg | ORAL_TABLET | Freq: Every morning | ORAL | 1 refills | Status: DC
  Filled 2022-07-13: qty 90, 90d supply, fill #0

## 2022-07-14 ENCOUNTER — Other Ambulatory Visit: Payer: Self-pay

## 2022-07-15 ENCOUNTER — Other Ambulatory Visit (HOSPITAL_COMMUNITY): Payer: Self-pay

## 2022-07-15 ENCOUNTER — Other Ambulatory Visit: Payer: Self-pay

## 2022-07-17 ENCOUNTER — Other Ambulatory Visit (HOSPITAL_COMMUNITY): Payer: Self-pay

## 2022-07-17 MED ORDER — OXYCODONE-ACETAMINOPHEN 7.5-325 MG PO TABS
1.0000 | ORAL_TABLET | Freq: Four times a day (QID) | ORAL | 0 refills | Status: DC | PRN
  Filled 2022-07-17 (×2): qty 120, 30d supply, fill #0

## 2022-07-17 MED ORDER — ALPRAZOLAM 0.5 MG PO TABS
0.2500 mg | ORAL_TABLET | Freq: Two times a day (BID) | ORAL | 0 refills | Status: DC | PRN
  Filled 2022-07-17 (×3): qty 60, 30d supply, fill #0

## 2022-07-20 ENCOUNTER — Other Ambulatory Visit (HOSPITAL_COMMUNITY): Payer: Self-pay

## 2022-08-12 ENCOUNTER — Other Ambulatory Visit: Payer: Self-pay

## 2022-08-12 ENCOUNTER — Other Ambulatory Visit (HOSPITAL_COMMUNITY): Payer: Self-pay

## 2022-08-12 MED ORDER — FUROSEMIDE 20 MG PO TABS
20.0000 mg | ORAL_TABLET | Freq: Every day | ORAL | 1 refills | Status: DC | PRN
  Filled 2022-08-12 – 2022-12-15 (×4): qty 30, 30d supply, fill #0
  Filled 2023-02-22: qty 30, 30d supply, fill #1

## 2022-08-12 MED ORDER — BENAZEPRIL HCL 40 MG PO TABS
40.0000 mg | ORAL_TABLET | Freq: Every day | ORAL | 0 refills | Status: DC
  Filled 2022-08-12: qty 90, 90d supply, fill #0

## 2022-08-12 MED ORDER — OXYCODONE-ACETAMINOPHEN 7.5-325 MG PO TABS
1.0000 | ORAL_TABLET | Freq: Four times a day (QID) | ORAL | 0 refills | Status: DC
  Filled 2022-08-17: qty 120, 30d supply, fill #0

## 2022-08-12 MED ORDER — ALPRAZOLAM 0.5 MG PO TABS
0.2500 mg | ORAL_TABLET | Freq: Two times a day (BID) | ORAL | 0 refills | Status: DC | PRN
  Filled 2022-08-17: qty 60, 30d supply, fill #0

## 2022-08-12 MED ORDER — HYDROCHLOROTHIAZIDE 25 MG PO TABS
25.0000 mg | ORAL_TABLET | Freq: Every day | ORAL | 0 refills | Status: DC
  Filled 2022-09-10 – 2022-09-21 (×2): qty 90, 90d supply, fill #0

## 2022-08-13 ENCOUNTER — Other Ambulatory Visit (HOSPITAL_COMMUNITY): Payer: Self-pay

## 2022-08-17 ENCOUNTER — Other Ambulatory Visit: Payer: Self-pay

## 2022-08-17 ENCOUNTER — Other Ambulatory Visit (HOSPITAL_COMMUNITY): Payer: Self-pay

## 2022-09-04 ENCOUNTER — Other Ambulatory Visit (HOSPITAL_COMMUNITY): Payer: Self-pay | Admitting: Family Medicine

## 2022-09-04 ENCOUNTER — Ambulatory Visit (HOSPITAL_COMMUNITY)
Admission: RE | Admit: 2022-09-04 | Discharge: 2022-09-04 | Disposition: A | Discharge status: Home / Self Care | Payer: HMO | Source: Ambulatory Visit | Attending: Vascular Surgery | Admitting: Vascular Surgery

## 2022-09-04 ENCOUNTER — Other Ambulatory Visit (HOSPITAL_COMMUNITY): Payer: Self-pay

## 2022-09-04 DIAGNOSIS — L84 Corns and callosities: Secondary | ICD-10-CM | POA: Diagnosis not present

## 2022-09-04 DIAGNOSIS — B351 Tinea unguium: Secondary | ICD-10-CM | POA: Diagnosis not present

## 2022-09-04 DIAGNOSIS — M7989 Other specified soft tissue disorders: Secondary | ICD-10-CM | POA: Diagnosis not present

## 2022-09-04 DIAGNOSIS — L03116 Cellulitis of left lower limb: Secondary | ICD-10-CM | POA: Diagnosis not present

## 2022-09-04 DIAGNOSIS — L03119 Cellulitis of unspecified part of limb: Secondary | ICD-10-CM | POA: Diagnosis not present

## 2022-09-04 MED ORDER — DOXYCYCLINE MONOHYDRATE 100 MG PO CAPS
100.0000 mg | ORAL_CAPSULE | Freq: Two times a day (BID) | ORAL | 0 refills | Status: DC
  Filled 2022-09-04 – 2022-09-10 (×2): qty 20, 10d supply, fill #0

## 2022-09-07 ENCOUNTER — Other Ambulatory Visit: Payer: Self-pay | Admitting: Family Medicine

## 2022-09-07 ENCOUNTER — Ambulatory Visit: Admission: RE | Admit: 2022-09-07 | Payer: HMO | Source: Ambulatory Visit

## 2022-09-07 DIAGNOSIS — L03119 Cellulitis of unspecified part of limb: Secondary | ICD-10-CM

## 2022-09-07 DIAGNOSIS — M7989 Other specified soft tissue disorders: Secondary | ICD-10-CM | POA: Diagnosis not present

## 2022-09-10 ENCOUNTER — Other Ambulatory Visit (HOSPITAL_COMMUNITY): Payer: Self-pay

## 2022-09-11 DIAGNOSIS — D72829 Elevated white blood cell count, unspecified: Secondary | ICD-10-CM | POA: Diagnosis not present

## 2022-09-11 DIAGNOSIS — N289 Disorder of kidney and ureter, unspecified: Secondary | ICD-10-CM | POA: Diagnosis not present

## 2022-09-11 DIAGNOSIS — L0390 Cellulitis, unspecified: Secondary | ICD-10-CM | POA: Diagnosis not present

## 2022-09-16 ENCOUNTER — Other Ambulatory Visit (HOSPITAL_COMMUNITY): Payer: Self-pay

## 2022-09-17 ENCOUNTER — Other Ambulatory Visit (HOSPITAL_COMMUNITY): Payer: Self-pay

## 2022-09-17 MED ORDER — OXYCODONE-ACETAMINOPHEN 7.5-325 MG PO TABS
1.0000 | ORAL_TABLET | Freq: Four times a day (QID) | ORAL | 0 refills | Status: DC | PRN
  Filled 2022-09-17 – 2022-09-18 (×2): qty 120, 30d supply, fill #0

## 2022-09-17 MED ORDER — ALPRAZOLAM 0.5 MG PO TABS
0.2500 mg | ORAL_TABLET | Freq: Two times a day (BID) | ORAL | 0 refills | Status: DC | PRN
  Filled 2022-09-17 – 2022-10-19 (×2): qty 60, 30d supply, fill #0

## 2022-09-18 ENCOUNTER — Ambulatory Visit (INDEPENDENT_AMBULATORY_CARE_PROVIDER_SITE_OTHER): Payer: HMO | Admitting: Podiatry

## 2022-09-18 ENCOUNTER — Ambulatory Visit: Payer: HMO

## 2022-09-18 ENCOUNTER — Other Ambulatory Visit (HOSPITAL_COMMUNITY): Payer: Self-pay

## 2022-09-18 ENCOUNTER — Other Ambulatory Visit: Payer: Self-pay

## 2022-09-18 ENCOUNTER — Encounter: Payer: Self-pay | Admitting: Podiatry

## 2022-09-18 DIAGNOSIS — L97521 Non-pressure chronic ulcer of other part of left foot limited to breakdown of skin: Secondary | ICD-10-CM

## 2022-09-18 MED ORDER — SILVER SULFADIAZINE 1 % EX CREA
1.0000 | TOPICAL_CREAM | Freq: Every day | CUTANEOUS | 0 refills | Status: DC
  Filled 2022-09-18: qty 50, 50d supply, fill #0

## 2022-09-18 NOTE — Progress Notes (Unsigned)
Subjective:   Patient ID: Megan Harmon, female   DOB: 89 y.o.   MRN: [id number]   HPI Chief Complaint  Patient presents with   Toe Pain    Hallux left - dark, callused area medial side x 2 weeks, has been on 10 days of Doxy, ultrasound to R/O DVT, xrayed to check bone, getting better, but still wanted it checked   New Patient (Initial Visit)    Callus on the left hallux got infected 2-3 weeks ago. PCP did a shot of abx and doxy for 10 days  no fevers or chills. It has been draining but it has stopped the last couple of days. Apply neopsorin to the ulcer   Not idabetic No blood thinner   ROS      Objective:  Physical Exam  ***     Assessment:  ***     Plan:  ***

## 2022-09-19 ENCOUNTER — Other Ambulatory Visit (HOSPITAL_COMMUNITY): Payer: Self-pay

## 2022-09-21 ENCOUNTER — Other Ambulatory Visit: Payer: Self-pay

## 2022-09-21 ENCOUNTER — Other Ambulatory Visit (HOSPITAL_COMMUNITY): Payer: Self-pay

## 2022-09-21 MED ORDER — BENAZEPRIL HCL 40 MG PO TABS
40.0000 mg | ORAL_TABLET | Freq: Every day | ORAL | 0 refills | Status: DC
  Filled 2022-09-28 – 2022-12-15 (×2): qty 90, 90d supply, fill #0

## 2022-09-21 MED ORDER — MELOXICAM 15 MG PO TABS
15.0000 mg | ORAL_TABLET | Freq: Every day | ORAL | 0 refills | Status: DC
  Filled 2022-09-21: qty 90, 90d supply, fill #0

## 2022-09-22 ENCOUNTER — Other Ambulatory Visit (HOSPITAL_COMMUNITY): Payer: Self-pay

## 2022-09-25 ENCOUNTER — Other Ambulatory Visit (HOSPITAL_COMMUNITY): Payer: Self-pay

## 2022-09-28 ENCOUNTER — Other Ambulatory Visit (HOSPITAL_COMMUNITY): Payer: Self-pay

## 2022-09-28 DIAGNOSIS — E782 Mixed hyperlipidemia: Secondary | ICD-10-CM | POA: Diagnosis not present

## 2022-09-28 DIAGNOSIS — Z23 Encounter for immunization: Secondary | ICD-10-CM | POA: Diagnosis not present

## 2022-09-28 DIAGNOSIS — E669 Obesity, unspecified: Secondary | ICD-10-CM | POA: Diagnosis not present

## 2022-09-28 DIAGNOSIS — R609 Edema, unspecified: Secondary | ICD-10-CM | POA: Diagnosis not present

## 2022-09-28 DIAGNOSIS — E039 Hypothyroidism, unspecified: Secondary | ICD-10-CM | POA: Diagnosis not present

## 2022-09-28 DIAGNOSIS — L97509 Non-pressure chronic ulcer of other part of unspecified foot with unspecified severity: Secondary | ICD-10-CM | POA: Diagnosis not present

## 2022-09-28 DIAGNOSIS — I10 Essential (primary) hypertension: Secondary | ICD-10-CM | POA: Diagnosis not present

## 2022-09-28 DIAGNOSIS — N179 Acute kidney failure, unspecified: Secondary | ICD-10-CM | POA: Diagnosis not present

## 2022-09-28 DIAGNOSIS — R7301 Impaired fasting glucose: Secondary | ICD-10-CM | POA: Diagnosis not present

## 2022-10-01 ENCOUNTER — Ambulatory Visit: Payer: HMO | Admitting: Podiatry

## 2022-10-12 ENCOUNTER — Other Ambulatory Visit (HOSPITAL_COMMUNITY): Payer: Self-pay

## 2022-10-13 ENCOUNTER — Other Ambulatory Visit (HOSPITAL_COMMUNITY): Payer: Self-pay

## 2022-10-13 ENCOUNTER — Other Ambulatory Visit: Payer: Self-pay

## 2022-10-13 MED ORDER — HYDROCHLOROTHIAZIDE 25 MG PO TABS
25.0000 mg | ORAL_TABLET | Freq: Every morning | ORAL | 1 refills | Status: DC
  Filled 2022-10-13 – 2022-12-15 (×2): qty 90, 90d supply, fill #0
  Filled 2023-03-23: qty 90, 90d supply, fill #1

## 2022-10-19 ENCOUNTER — Other Ambulatory Visit (HOSPITAL_COMMUNITY): Payer: Self-pay

## 2022-10-20 ENCOUNTER — Other Ambulatory Visit (HOSPITAL_COMMUNITY): Payer: Self-pay

## 2022-10-20 ENCOUNTER — Other Ambulatory Visit: Payer: Self-pay

## 2022-10-20 MED ORDER — OXYCODONE-ACETAMINOPHEN 7.5-325 MG PO TABS
1.0000 | ORAL_TABLET | Freq: Four times a day (QID) | ORAL | 0 refills | Status: DC | PRN
  Filled 2022-10-20: qty 120, 30d supply, fill #0

## 2022-11-03 ENCOUNTER — Encounter: Payer: Self-pay | Admitting: Podiatry

## 2022-11-03 ENCOUNTER — Ambulatory Visit (INDEPENDENT_AMBULATORY_CARE_PROVIDER_SITE_OTHER): Payer: HMO | Admitting: Podiatry

## 2022-11-03 VITALS — Ht 66.0 in | Wt 225.0 lb

## 2022-11-03 DIAGNOSIS — L97522 Non-pressure chronic ulcer of other part of left foot with fat layer exposed: Secondary | ICD-10-CM

## 2022-11-03 DIAGNOSIS — L03116 Cellulitis of left lower limb: Secondary | ICD-10-CM | POA: Diagnosis not present

## 2022-11-03 MED ORDER — MUPIROCIN 2 % EX OINT: 1.0000 | TOPICAL_OINTMENT | Freq: Two times a day (BID) | CUTANEOUS | 2 refills | Status: DC

## 2022-11-03 MED ORDER — CEPHALEXIN 500 MG PO CAPS: 500.0000 mg | ORAL_CAPSULE | Freq: Four times a day (QID) | ORAL | 0 refills | Status: AC

## 2022-11-03 NOTE — Patient Instructions (Signed)
Call [facility] to schedule your MRI at the below locations.  Please allow at least 1 business day after your visit to process the referral.  It may take longer depending on approval from insurance.  Please let me know if you have issues or problems scheduling the MRI   [facility] [phone] [address] [place], [place] [address]  [facility] [phone] [address] [place], [place] [address]  

## 2022-11-04 NOTE — Progress Notes (Signed)
Subjective:  Patient ID: Megan Harmon, female    DOB: 1932/05/19,  MRN: [id number]  Chief Complaint  Patient presents with   Ingrown Toenail    RM2: Left hallux infection    Discussed the use of AI scribe software for clinical note transcription with the patient, who gave verbal consent to proceed.  History of Present Illness   The patient, with a history of arthritis, presents with a worsening foot ulcer. The ulcer, initially a 'small little sore,' improved with antibiotics and debridement by a podiatrist. However, the ulcer worsened again, prompting another course of antibiotics. The patient completed the antibiotics last week, but the ulcer has continued to worsen. The patient denies diabetes and allergies. The patient has been wearing 'fuzzy socks' and no shoes. The patient's arthritis is causing pain in the leg.          Objective:    Physical Exam   SKIN: Well perfused foot with good capillary fill time. Cellulitis throughout the foot and to the ankle. Large hyperkeratotic lesion with blistering and bleeding deep to it on the plantar medial hallux. Ulceration extends to the subcutaneous tissue. No purulent drainage.       No images are attached to the encounter.    Results   Procedure: Callus Trimming Description: Callus was trimmed from the plantar medial hallux.  Procedure: Wound Culture Description: Culture was taken from the ulceration on the plantar medial hallux.  RADIOLOGY Toe X-ray: No signs of osteomyelitis. Leg Ultrasound: No evidence of deep vein thrombosis (DVT).      Assessment:   1. Ulcer of left foot with fat layer exposed (HCC)   2. Cellulitis of foot, left      Plan:  Patient was evaluated and treated and all questions answered.  Assessment and Plan    Cellulitis and ulceration of left foot   She exhibits worsening redness and pain in the left foot since completing antibiotics last week, with no systemic symptoms or purulent drainage  noted. The ulceration reaches the subcutaneous tissue. We will start Cephalexin 500mg  QID for 2 weeks and apply Mupirocin ointment BID. She should change the dressing daily and wear a surgical shoe for walking. An order for a stat MRI of the left great toe will evaluate for osteomyelitis. A follow-up is scheduled in 2 weeks. Should redness spread, pain worsen, or systemic symptoms develop, she must go to the emergency room.          Return in about 2 weeks (around 11/17/2022) for wound care.

## 2022-11-05 ENCOUNTER — Ambulatory Visit
Admission: RE | Admit: 2022-11-05 | Discharge: 2022-11-05 | Disposition: A | Discharge status: Home / Self Care | Payer: HMO | Source: Ambulatory Visit | Attending: Podiatry | Admitting: Podiatry

## 2022-11-05 DIAGNOSIS — R600 Localized edema: Secondary | ICD-10-CM | POA: Diagnosis not present

## 2022-11-05 DIAGNOSIS — L97522 Non-pressure chronic ulcer of other part of left foot with fat layer exposed: Secondary | ICD-10-CM

## 2022-11-18 ENCOUNTER — Other Ambulatory Visit (HOSPITAL_COMMUNITY): Payer: Self-pay

## 2022-11-19 ENCOUNTER — Encounter: Payer: Self-pay | Admitting: Podiatry

## 2022-11-19 ENCOUNTER — Ambulatory Visit (INDEPENDENT_AMBULATORY_CARE_PROVIDER_SITE_OTHER): Payer: HMO | Admitting: Podiatry

## 2022-11-19 DIAGNOSIS — L97522 Non-pressure chronic ulcer of other part of left foot with fat layer exposed: Secondary | ICD-10-CM | POA: Diagnosis not present

## 2022-11-20 ENCOUNTER — Other Ambulatory Visit (HOSPITAL_COMMUNITY): Payer: Self-pay

## 2022-11-20 ENCOUNTER — Encounter: Payer: Self-pay | Admitting: Podiatry

## 2022-11-20 ENCOUNTER — Other Ambulatory Visit: Payer: Self-pay

## 2022-11-20 MED ORDER — OXYCODONE-ACETAMINOPHEN 7.5-325 MG PO TABS
1.0000 | ORAL_TABLET | Freq: Four times a day (QID) | ORAL | 0 refills | Status: DC | PRN
  Filled 2022-11-20: qty 120, 30d supply, fill #0

## 2022-11-20 MED ORDER — ALPRAZOLAM 0.5 MG PO TABS
0.2500 mg | ORAL_TABLET | Freq: Two times a day (BID) | ORAL | 0 refills | Status: DC | PRN
  Filled 2022-11-20: qty 60, 30d supply, fill #0

## 2022-11-20 NOTE — Progress Notes (Signed)
  Subjective:  Patient ID: Megan Harmon, female    DOB: Aug 12, 1932,  MRN: [id number]  Chief Complaint  Patient presents with   Routine Post Op    PATIENT STATES THAT EVERYTHING HAS BEEN ABOUT THE SAME , THEY BELIEVE THE TYPE IS THE REASON HER SKIN IS TURNING A LITTLE RED AND HER LF IS A LITTLE SWELLING . PATIENT IS ALWAYS IN PAIN BECAUSE SHE HAS ARTHRITIDES       Discussed the use of AI scribe software for clinical note transcription with the patient, who gave verbal consent to proceed.  History of Present Illness   She returns for follow-up today with her daughter they have been using the ointment as directed         Objective:    Physical Exam   SKIN: Well perfused foot with good capillary fill time. Cellulitis has resolved.  Hyperkeratotic lesion present plantar medial hallux.  Debridement of this reveals a small ulceration approximately 5 mm x 2 mm x 2 mm.  Exposed subcutaneous tissue.  Bleeding easily controlled with hemostasis with manual pressure.  IMPRESSION: 1. There is trace marrow edema within the distal lateral aspect of the distal phalanx of the third toe. Question minimal thinning of the overlying skin. Recommend clinical correlation for any concerns of infection in this region, as the marrow edema could represent early osteomyelitis of the distal phalanx of the third toe. 2. There is focal high-grade increased T2 signal within the distal, deep aspect of the medial great toe metatarsophalangeal joint sesamoid. Degenerative change is favored. Early acute osteomyelitis of the medial great toe metatarsophalangeal joint sesamoid is considered but felt less likely. 3. No definitive marrow edema is seen within the great toe phalanges. Motion artifact limits evaluation, however it is difficult to exclude trace marrow edema within the distal most aspect of the distal phalanx of the great toe on a single sagittal image only. It is difficult to exclude minimal early  acute osteomyelitis in this region.       Assessment:   Encounter Diagnosis  Name Primary?   Ulcer of left foot with fat layer exposed (HCC) Yes      Plan:  Patient was evaluated and treated and all questions answered.  We reviewed the results of her MRI.  We discussed that there are concerns of osteomyelitis and areas that she has never had a wound in and I suspect likely this is stress reaction with a motion degraded study.  We also discussed that the area of concern where the ulcer is does not show any definitive bone infection and her wound is healing nicely.  Does not show any clinical signs of infection currently.  At this point we will continue local wound care if infection returns we will consider other treatment options.  Return in 3 weeks for ongoing wound care.      Return in about 2 weeks (around 11/17/2022) for wound care.

## 2022-12-01 ENCOUNTER — Other Ambulatory Visit (HOSPITAL_COMMUNITY): Payer: Self-pay

## 2022-12-08 ENCOUNTER — Ambulatory Visit: Payer: HMO | Admitting: Podiatry

## 2022-12-15 ENCOUNTER — Other Ambulatory Visit (HOSPITAL_COMMUNITY): Payer: Self-pay

## 2022-12-15 ENCOUNTER — Other Ambulatory Visit: Payer: Self-pay

## 2022-12-15 MED ORDER — MELOXICAM 15 MG PO TABS
15.0000 mg | ORAL_TABLET | Freq: Every day | ORAL | 0 refills | Status: DC
  Filled 2022-12-15: qty 90, 90d supply, fill #0

## 2022-12-21 ENCOUNTER — Other Ambulatory Visit (HOSPITAL_COMMUNITY): Payer: Self-pay

## 2022-12-22 ENCOUNTER — Other Ambulatory Visit (HOSPITAL_COMMUNITY): Payer: Self-pay

## 2022-12-23 ENCOUNTER — Other Ambulatory Visit (HOSPITAL_COMMUNITY): Payer: Self-pay

## 2022-12-23 ENCOUNTER — Other Ambulatory Visit: Payer: Self-pay

## 2022-12-23 MED ORDER — OXYCODONE-ACETAMINOPHEN 7.5-325 MG PO TABS
1.0000 | ORAL_TABLET | Freq: Four times a day (QID) | ORAL | 0 refills | Status: DC | PRN
  Filled 2022-12-23: qty 120, 30d supply, fill #0

## 2022-12-23 MED ORDER — ALPRAZOLAM 0.5 MG PO TABS
0.5000 mg | ORAL_TABLET | Freq: Two times a day (BID) | ORAL | 0 refills | Status: DC | PRN
  Filled 2022-12-23: qty 60, 15d supply, fill #0

## 2022-12-28 ENCOUNTER — Ambulatory Visit: Payer: HMO | Admitting: Podiatry

## 2023-01-08 ENCOUNTER — Other Ambulatory Visit (HOSPITAL_COMMUNITY): Payer: Self-pay

## 2023-01-11 ENCOUNTER — Ambulatory Visit (INDEPENDENT_AMBULATORY_CARE_PROVIDER_SITE_OTHER): Payer: HMO | Admitting: Podiatry

## 2023-01-11 ENCOUNTER — Other Ambulatory Visit: Payer: Self-pay

## 2023-01-11 ENCOUNTER — Encounter: Payer: Self-pay | Admitting: Podiatry

## 2023-01-11 ENCOUNTER — Other Ambulatory Visit (HOSPITAL_COMMUNITY): Payer: Self-pay

## 2023-01-11 DIAGNOSIS — M79675 Pain in left toe(s): Secondary | ICD-10-CM

## 2023-01-11 DIAGNOSIS — L97522 Non-pressure chronic ulcer of other part of left foot with fat layer exposed: Secondary | ICD-10-CM

## 2023-01-11 DIAGNOSIS — B351 Tinea unguium: Secondary | ICD-10-CM

## 2023-01-11 DIAGNOSIS — M79674 Pain in right toe(s): Secondary | ICD-10-CM | POA: Diagnosis not present

## 2023-01-11 MED ORDER — SIMVASTATIN 20 MG PO TABS
20.0000 mg | ORAL_TABLET | Freq: Every evening | ORAL | 1 refills | Status: DC
  Filled 2023-01-11: qty 100, 100d supply, fill #0
  Filled 2023-04-18: qty 100, 100d supply, fill #1

## 2023-01-11 MED ORDER — CITALOPRAM HYDROBROMIDE 40 MG PO TABS
40.0000 mg | ORAL_TABLET | Freq: Every day | ORAL | 1 refills | Status: DC
  Filled 2023-01-11: qty 100, 100d supply, fill #0
  Filled 2023-04-18: qty 100, 100d supply, fill #1

## 2023-01-27 ENCOUNTER — Other Ambulatory Visit: Payer: Self-pay

## 2023-01-27 ENCOUNTER — Other Ambulatory Visit (HOSPITAL_COMMUNITY): Payer: Self-pay

## 2023-01-27 MED ORDER — ALPRAZOLAM 0.5 MG PO TABS
0.2500 mg | ORAL_TABLET | Freq: Two times a day (BID) | ORAL | 0 refills | Status: DC | PRN
  Filled 2023-01-27 (×2): qty 60, 30d supply, fill #0

## 2023-01-27 MED ORDER — OXYCODONE-ACETAMINOPHEN 7.5-325 MG PO TABS
1.0000 | ORAL_TABLET | Freq: Four times a day (QID) | ORAL | 0 refills | Status: DC | PRN
  Filled 2023-01-27: qty 120, 30d supply, fill #0
  Filled 2023-01-27: qty 28, 7d supply, fill #0

## 2023-01-28 ENCOUNTER — Other Ambulatory Visit: Payer: Self-pay

## 2023-02-02 ENCOUNTER — Ambulatory Visit: Payer: HMO | Admitting: Podiatry

## 2023-02-09 ENCOUNTER — Encounter: Payer: Self-pay | Admitting: Podiatry

## 2023-02-09 ENCOUNTER — Ambulatory Visit (INDEPENDENT_AMBULATORY_CARE_PROVIDER_SITE_OTHER): Payer: HMO | Admitting: Podiatry

## 2023-02-09 DIAGNOSIS — L97521 Non-pressure chronic ulcer of other part of left foot limited to breakdown of skin: Secondary | ICD-10-CM

## 2023-02-11 NOTE — Progress Notes (Signed)
  Subjective:  Patient ID: Megan Harmon, female    DOB: [birth year],  MRN: [id number]  Chief Complaint  Patient presents with   Routine Post Op    Patient states that her left foot is still sore.     History of Present Illness   She returns for follow-up today with her family again, says it has been sore and hurting again, says that she wears only socks and does not wear shoes at home         Objective:    Physical Exam   SKIN: Well perfused foot with good capillary fill time.  No cellulitis or infection.  Hyperkeratotic lesion present plantar medial hallux.  Debridement of this reveals only partial-thickness skin breakdown.  Bleeding easily controlled with hemostasis with manual pressure.  Thickened elongated dystrophic mycotic nails x 10   IMPRESSION: 1. There is trace marrow edema within the distal lateral aspect of the distal phalanx of the third toe. Question minimal thinning of the overlying skin. Recommend clinical correlation for any concerns of infection in this region, as the marrow edema could represent early osteomyelitis of the distal phalanx of the third toe. 2. There is focal high-grade increased T2 signal within the distal, deep aspect of the medial great toe metatarsophalangeal joint sesamoid. Degenerative change is favored. Early acute osteomyelitis of the medial great toe metatarsophalangeal joint sesamoid is considered but felt less likely. 3. No definitive marrow edema is seen within the great toe phalanges. Motion artifact limits evaluation, however it is difficult to exclude trace marrow edema within the distal most aspect of the distal phalanx of the great toe on a single sagittal image only. It is difficult to exclude minimal early acute osteomyelitis in this region.       Assessment:   Encounter Diagnosis  Name Primary?   Ulcer of left foot, limited to breakdown of skin (HCC) Yes       Plan:  Patient was evaluated and treated and all  questions answered.  Debrided the overlying callus and there is only partial-thickness skin breakdown still.  Reviewed with the importance of wearing supportive soft tissue and I recommended easy Spirit shoes.  Recommend wrapping the toe with gauze to padded and protected as well as using a silicone pad on the toe to keep pressure off of this.  I will see her back in a few weeks to reevaluate.   Return in about 3 weeks (around 03/02/2023) for wound care.

## 2023-02-22 ENCOUNTER — Other Ambulatory Visit (HOSPITAL_COMMUNITY): Payer: Self-pay

## 2023-02-24 ENCOUNTER — Other Ambulatory Visit (HOSPITAL_COMMUNITY): Payer: Self-pay

## 2023-02-24 MED ORDER — OXYCODONE-ACETAMINOPHEN 7.5-325 MG PO TABS
1.0000 | ORAL_TABLET | Freq: Four times a day (QID) | ORAL | 0 refills | Status: DC
  Filled 2023-02-26 (×2): qty 120, 30d supply, fill #0

## 2023-02-24 MED ORDER — ALPRAZOLAM 0.5 MG PO TABS
0.2500 mg | ORAL_TABLET | Freq: Two times a day (BID) | ORAL | 0 refills | Status: DC
  Filled 2023-02-26 (×2): qty 60, 30d supply, fill #0

## 2023-02-26 ENCOUNTER — Other Ambulatory Visit (HOSPITAL_COMMUNITY): Payer: Self-pay

## 2023-02-26 ENCOUNTER — Other Ambulatory Visit: Payer: Self-pay

## 2023-03-09 ENCOUNTER — Ambulatory Visit: Payer: HMO | Admitting: Podiatry

## 2023-03-17 ENCOUNTER — Ambulatory Visit: Payer: HMO | Admitting: Podiatry

## 2023-03-23 ENCOUNTER — Other Ambulatory Visit (HOSPITAL_COMMUNITY): Payer: Self-pay

## 2023-03-24 ENCOUNTER — Other Ambulatory Visit (HOSPITAL_COMMUNITY): Payer: Self-pay

## 2023-03-24 ENCOUNTER — Ambulatory Visit: Payer: HMO | Admitting: Podiatry

## 2023-03-24 ENCOUNTER — Other Ambulatory Visit: Payer: Self-pay

## 2023-03-24 DIAGNOSIS — L97521 Non-pressure chronic ulcer of other part of left foot limited to breakdown of skin: Secondary | ICD-10-CM | POA: Diagnosis not present

## 2023-03-24 MED ORDER — OMEPRAZOLE 40 MG PO CPDR
40.0000 mg | DELAYED_RELEASE_CAPSULE | Freq: Two times a day (BID) | ORAL | 0 refills | Status: DC
  Filled 2023-03-24: qty 100, 50d supply, fill #0

## 2023-03-24 MED ORDER — MELOXICAM 15 MG PO TABS
15.0000 mg | ORAL_TABLET | Freq: Every day | ORAL | 0 refills | Status: DC
  Filled 2023-03-24: qty 100, 100d supply, fill #0

## 2023-03-24 MED ORDER — FUROSEMIDE 20 MG PO TABS
20.0000 mg | ORAL_TABLET | Freq: Every day | ORAL | 0 refills | Status: DC | PRN
  Filled 2023-03-24: qty 100, 100d supply, fill #0

## 2023-03-24 MED ORDER — BENAZEPRIL HCL 40 MG PO TABS
40.0000 mg | ORAL_TABLET | Freq: Every day | ORAL | 0 refills | Status: DC
  Filled 2023-03-24: qty 100, 100d supply, fill #0

## 2023-03-24 NOTE — Progress Notes (Signed)
  Subjective:  Patient ID: Megan Harmon, female    DOB: May 19, 1932,  MRN: [id number]  Chief Complaint  Patient presents with   Foot Ulcer     History of Present Illness   She returns for follow-up today with her family again, says it has been sore and hurting again, says that she wears only socks and does not wear shoes at home         Objective:    Physical Exam   SKIN: Well perfused foot with good capillary fill time.  No cellulitis or infection.  Hyperkeratotic lesion present plantar medial hallux.  Debridement of this reveals only partial-thickness skin breakdown.  Bleeding easily controlled with hemostasis with manual pressure.  Thickened elongated dystrophic mycotic nails x 10   IMPRESSION: 1. There is trace marrow edema within the distal lateral aspect of the distal phalanx of the third toe. Question minimal thinning of the overlying skin. Recommend clinical correlation for any concerns of infection in this region, as the marrow edema could represent early osteomyelitis of the distal phalanx of the third toe. 2. There is focal high-grade increased T2 signal within the distal, deep aspect of the medial great toe metatarsophalangeal joint sesamoid. Degenerative change is favored. Early acute osteomyelitis of the medial great toe metatarsophalangeal joint sesamoid is considered but felt less likely. 3. No definitive marrow edema is seen within the great toe phalanges. Motion artifact limits evaluation, however it is difficult to exclude trace marrow edema within the distal most aspect of the distal phalanx of the great toe on a single sagittal image only. It is difficult to exclude minimal early acute osteomyelitis in this region.       Assessment:   No diagnosis found.      Plan:  Patient was evaluated and treated and all questions answered.  Debrided the overlying callus and there is only partial-thickness skin breakdown still.  We discussed supportive  shoes.  I discussed with the patient that she may benefit from exostectomy in the future if it continues to get worse.  I discussed this with the patient in extensive detail.  At this time this is a partial-thickness wound.  Encouraged her to keep it covered.  I encouraged her to wear shoes at all times and struggling barefooted.  She will see me or Dr. Lilian Kapur back in the future,   No follow-ups on file.

## 2023-03-26 ENCOUNTER — Other Ambulatory Visit (HOSPITAL_COMMUNITY): Payer: Self-pay

## 2023-03-29 ENCOUNTER — Other Ambulatory Visit: Payer: Self-pay

## 2023-03-29 ENCOUNTER — Other Ambulatory Visit (HOSPITAL_COMMUNITY): Payer: Self-pay

## 2023-03-29 DIAGNOSIS — L97509 Non-pressure chronic ulcer of other part of unspecified foot with unspecified severity: Secondary | ICD-10-CM | POA: Diagnosis not present

## 2023-03-29 DIAGNOSIS — R7301 Impaired fasting glucose: Secondary | ICD-10-CM | POA: Diagnosis not present

## 2023-03-29 DIAGNOSIS — E039 Hypothyroidism, unspecified: Secondary | ICD-10-CM | POA: Diagnosis not present

## 2023-03-29 DIAGNOSIS — M899 Disorder of bone, unspecified: Secondary | ICD-10-CM | POA: Diagnosis not present

## 2023-03-29 DIAGNOSIS — E782 Mixed hyperlipidemia: Secondary | ICD-10-CM | POA: Diagnosis not present

## 2023-03-29 DIAGNOSIS — F411 Generalized anxiety disorder: Secondary | ICD-10-CM | POA: Diagnosis not present

## 2023-03-29 DIAGNOSIS — F330 Major depressive disorder, recurrent, mild: Secondary | ICD-10-CM | POA: Diagnosis not present

## 2023-03-29 DIAGNOSIS — E669 Obesity, unspecified: Secondary | ICD-10-CM | POA: Diagnosis not present

## 2023-03-29 DIAGNOSIS — M1990 Unspecified osteoarthritis, unspecified site: Secondary | ICD-10-CM | POA: Diagnosis not present

## 2023-03-29 DIAGNOSIS — Z0000 Encounter for general adult medical examination without abnormal findings: Secondary | ICD-10-CM | POA: Diagnosis not present

## 2023-03-29 DIAGNOSIS — H409 Unspecified glaucoma: Secondary | ICD-10-CM | POA: Diagnosis not present

## 2023-03-29 DIAGNOSIS — I10 Essential (primary) hypertension: Secondary | ICD-10-CM | POA: Diagnosis not present

## 2023-03-29 MED ORDER — ALPRAZOLAM 0.5 MG PO TABS
0.2500 mg | ORAL_TABLET | Freq: Two times a day (BID) | ORAL | 0 refills | Status: DC | PRN
Start: 2023-03-29 — End: 2023-04-28
  Filled 2023-03-29: qty 60, 30d supply, fill #0

## 2023-03-29 MED ORDER — OXYCODONE-ACETAMINOPHEN 10-325 MG PO TABS
1.0000 | ORAL_TABLET | Freq: Four times a day (QID) | ORAL | 0 refills | Status: DC | PRN
  Filled 2023-03-29: qty 120, 30d supply, fill #0

## 2023-03-30 DIAGNOSIS — I10 Essential (primary) hypertension: Secondary | ICD-10-CM | POA: Diagnosis not present

## 2023-03-31 ENCOUNTER — Other Ambulatory Visit (HOSPITAL_COMMUNITY): Payer: Self-pay

## 2023-04-17 ENCOUNTER — Other Ambulatory Visit (HOSPITAL_COMMUNITY): Payer: Self-pay

## 2023-04-19 ENCOUNTER — Other Ambulatory Visit (HOSPITAL_COMMUNITY): Payer: Self-pay

## 2023-04-27 ENCOUNTER — Other Ambulatory Visit (HOSPITAL_COMMUNITY): Payer: Self-pay

## 2023-04-28 ENCOUNTER — Other Ambulatory Visit (HOSPITAL_COMMUNITY): Payer: Self-pay

## 2023-04-28 ENCOUNTER — Other Ambulatory Visit: Payer: Self-pay

## 2023-04-28 MED ORDER — ALPRAZOLAM 0.5 MG PO TABS
0.2500 mg | ORAL_TABLET | Freq: Two times a day (BID) | ORAL | 0 refills | Status: DC | PRN
Start: 2023-04-28 — End: 2023-05-28
  Filled 2023-04-28: qty 60, 30d supply, fill #0

## 2023-04-28 MED ORDER — OXYCODONE-ACETAMINOPHEN 10-325 MG PO TABS
1.0000 | ORAL_TABLET | Freq: Four times a day (QID) | ORAL | 0 refills | Status: DC
Start: 2023-04-28 — End: 2023-05-28
  Filled 2023-04-28: qty 120, 30d supply, fill #0

## 2023-04-29 ENCOUNTER — Other Ambulatory Visit (HOSPITAL_COMMUNITY): Payer: Self-pay

## 2023-05-06 ENCOUNTER — Other Ambulatory Visit (HOSPITAL_COMMUNITY): Payer: Self-pay

## 2023-05-07 ENCOUNTER — Other Ambulatory Visit (HOSPITAL_COMMUNITY): Payer: Self-pay

## 2023-05-07 MED ORDER — CITALOPRAM HYDROBROMIDE 40 MG PO TABS
40.0000 mg | ORAL_TABLET | Freq: Every day | ORAL | 1 refills | Status: AC
  Filled 2023-05-07 – 2023-08-13 (×2): qty 100, 100d supply, fill #0
  Filled 2024-01-04 – 2024-01-05 (×2): qty 100, 100d supply, fill #1

## 2023-05-24 ENCOUNTER — Other Ambulatory Visit (HOSPITAL_COMMUNITY): Payer: Self-pay

## 2023-05-28 ENCOUNTER — Other Ambulatory Visit (HOSPITAL_COMMUNITY): Payer: Self-pay

## 2023-05-28 ENCOUNTER — Other Ambulatory Visit: Payer: Self-pay

## 2023-05-28 MED ORDER — OXYCODONE-ACETAMINOPHEN 10-325 MG PO TABS
1.0000 | ORAL_TABLET | Freq: Four times a day (QID) | ORAL | 0 refills | Status: DC
  Filled 2023-05-28: qty 120, 30d supply, fill #0

## 2023-05-28 MED ORDER — ALPRAZOLAM 0.5 MG PO TABS
0.2500 mg | ORAL_TABLET | Freq: Two times a day (BID) | ORAL | 0 refills | Status: DC | PRN
Start: 2023-05-28 — End: 2023-06-17
  Filled 2023-05-28: qty 60, 30d supply, fill #0

## 2023-06-17 ENCOUNTER — Other Ambulatory Visit (HOSPITAL_COMMUNITY): Payer: Self-pay

## 2023-06-17 MED ORDER — ALPRAZOLAM 0.5 MG PO TABS
0.2500 mg | ORAL_TABLET | Freq: Three times a day (TID) | ORAL | 0 refills | Status: DC | PRN
  Filled 2023-06-25: qty 90, 30d supply, fill #0

## 2023-06-23 ENCOUNTER — Other Ambulatory Visit (HOSPITAL_COMMUNITY): Payer: Self-pay

## 2023-06-25 ENCOUNTER — Other Ambulatory Visit: Payer: Self-pay

## 2023-06-25 ENCOUNTER — Other Ambulatory Visit (HOSPITAL_COMMUNITY): Payer: Self-pay

## 2023-06-25 MED ORDER — OXYCODONE-ACETAMINOPHEN 10-325 MG PO TABS
1.0000 | ORAL_TABLET | Freq: Four times a day (QID) | ORAL | 0 refills | Status: DC | PRN
  Filled 2023-06-25: qty 120, 30d supply, fill #0

## 2023-07-01 ENCOUNTER — Other Ambulatory Visit (HOSPITAL_COMMUNITY): Payer: Self-pay

## 2023-07-02 DIAGNOSIS — I10 Essential (primary) hypertension: Secondary | ICD-10-CM | POA: Diagnosis not present

## 2023-07-05 ENCOUNTER — Other Ambulatory Visit (HOSPITAL_COMMUNITY): Payer: Self-pay

## 2023-07-06 ENCOUNTER — Other Ambulatory Visit (HOSPITAL_COMMUNITY): Payer: Self-pay

## 2023-07-06 MED ORDER — MELOXICAM 15 MG PO TABS
15.0000 mg | ORAL_TABLET | Freq: Every day | ORAL | 0 refills | Status: DC
  Filled 2023-07-06: qty 100, 100d supply, fill #0

## 2023-07-08 ENCOUNTER — Other Ambulatory Visit (HOSPITAL_COMMUNITY): Payer: Self-pay

## 2023-07-09 ENCOUNTER — Other Ambulatory Visit: Payer: Self-pay

## 2023-07-09 ENCOUNTER — Other Ambulatory Visit (HOSPITAL_COMMUNITY): Payer: Self-pay

## 2023-07-09 MED ORDER — BENAZEPRIL HCL 40 MG PO TABS
40.0000 mg | ORAL_TABLET | Freq: Every day | ORAL | 0 refills | Status: AC
  Filled 2023-07-09 (×2): qty 100, 100d supply, fill #0

## 2023-07-09 MED ORDER — FUROSEMIDE 20 MG PO TABS
20.0000 mg | ORAL_TABLET | Freq: Every day | ORAL | 0 refills | Status: DC | PRN
  Filled 2023-07-09 (×2): qty 100, 100d supply, fill #0

## 2023-07-09 MED ORDER — HYDROCHLOROTHIAZIDE 25 MG PO TABS
25.0000 mg | ORAL_TABLET | Freq: Every morning | ORAL | 0 refills | Status: DC
  Filled 2023-07-09: qty 100, 100d supply, fill #0

## 2023-07-12 DIAGNOSIS — E669 Obesity, unspecified: Secondary | ICD-10-CM | POA: Diagnosis not present

## 2023-07-12 DIAGNOSIS — H409 Unspecified glaucoma: Secondary | ICD-10-CM | POA: Diagnosis not present

## 2023-07-12 DIAGNOSIS — E039 Hypothyroidism, unspecified: Secondary | ICD-10-CM | POA: Diagnosis not present

## 2023-07-12 DIAGNOSIS — F330 Major depressive disorder, recurrent, mild: Secondary | ICD-10-CM | POA: Diagnosis not present

## 2023-07-12 DIAGNOSIS — I10 Essential (primary) hypertension: Secondary | ICD-10-CM | POA: Diagnosis not present

## 2023-07-24 ENCOUNTER — Other Ambulatory Visit (HOSPITAL_COMMUNITY): Payer: Self-pay

## 2023-07-26 ENCOUNTER — Other Ambulatory Visit: Payer: Self-pay

## 2023-07-26 ENCOUNTER — Other Ambulatory Visit (HOSPITAL_COMMUNITY): Payer: Self-pay

## 2023-07-26 MED ORDER — ALPRAZOLAM 0.5 MG PO TABS
0.2500 mg | ORAL_TABLET | Freq: Three times a day (TID) | ORAL | 0 refills | Status: DC | PRN
  Filled 2023-07-26: qty 90, 30d supply, fill #0

## 2023-07-26 MED ORDER — OXYCODONE-ACETAMINOPHEN 10-325 MG PO TABS
1.0000 | ORAL_TABLET | Freq: Four times a day (QID) | ORAL | 0 refills | Status: DC
  Filled 2023-07-26: qty 120, 30d supply, fill #0

## 2023-07-27 ENCOUNTER — Other Ambulatory Visit: Payer: Self-pay

## 2023-07-27 ENCOUNTER — Other Ambulatory Visit (HOSPITAL_COMMUNITY): Payer: Self-pay

## 2023-07-27 ENCOUNTER — Encounter: Payer: Self-pay | Admitting: Pharmacist

## 2023-07-30 ENCOUNTER — Other Ambulatory Visit (HOSPITAL_COMMUNITY): Payer: Self-pay

## 2023-07-31 DIAGNOSIS — I10 Essential (primary) hypertension: Secondary | ICD-10-CM | POA: Diagnosis not present

## 2023-08-13 ENCOUNTER — Other Ambulatory Visit (HOSPITAL_BASED_OUTPATIENT_CLINIC_OR_DEPARTMENT_OTHER): Payer: Self-pay

## 2023-08-13 ENCOUNTER — Other Ambulatory Visit: Payer: Self-pay

## 2023-08-13 ENCOUNTER — Other Ambulatory Visit (HOSPITAL_COMMUNITY): Payer: Self-pay

## 2023-08-13 MED ORDER — SIMVASTATIN 20 MG PO TABS
20.0000 mg | ORAL_TABLET | Freq: Every evening | ORAL | 1 refills | Status: AC
  Filled 2023-08-13: qty 100, 100d supply, fill #0
  Filled 2024-01-04 – 2024-01-07 (×3): qty 100, 100d supply, fill #1

## 2023-08-24 ENCOUNTER — Other Ambulatory Visit: Payer: Self-pay

## 2023-08-24 ENCOUNTER — Other Ambulatory Visit (HOSPITAL_COMMUNITY): Payer: Self-pay

## 2023-08-24 MED ORDER — ALPRAZOLAM 0.5 MG PO TABS
0.2500 mg | ORAL_TABLET | Freq: Three times a day (TID) | ORAL | 0 refills | Status: DC | PRN
  Filled 2023-08-24: qty 90, 30d supply, fill #0

## 2023-08-24 MED ORDER — OXYCODONE-ACETAMINOPHEN 10-325 MG PO TABS
1.0000 | ORAL_TABLET | Freq: Four times a day (QID) | ORAL | 0 refills | Status: DC
  Filled 2023-08-24: qty 120, 30d supply, fill #0

## 2023-08-30 DIAGNOSIS — I10 Essential (primary) hypertension: Secondary | ICD-10-CM | POA: Diagnosis not present

## 2023-09-10 ENCOUNTER — Inpatient Hospital Stay (HOSPITAL_COMMUNITY)

## 2023-09-10 ENCOUNTER — Inpatient Hospital Stay (HOSPITAL_COMMUNITY)
Admission: EM | Admit: 2023-09-10 | Discharge: 2023-09-18 | DRG: 682 | Disposition: A | Discharge status: Skilled Nursing Facility | Attending: Student | Admitting: Student

## 2023-09-10 ENCOUNTER — Encounter (HOSPITAL_COMMUNITY): Payer: Self-pay | Admitting: Emergency Medicine

## 2023-09-10 ENCOUNTER — Other Ambulatory Visit: Payer: Self-pay

## 2023-09-10 ENCOUNTER — Emergency Department (HOSPITAL_COMMUNITY)

## 2023-09-10 DIAGNOSIS — R531 Weakness: Principal | ICD-10-CM

## 2023-09-10 DIAGNOSIS — R339 Retention of urine, unspecified: Secondary | ICD-10-CM | POA: Diagnosis not present

## 2023-09-10 DIAGNOSIS — M8580 Other specified disorders of bone density and structure, unspecified site: Secondary | ICD-10-CM | POA: Insufficient documentation

## 2023-09-10 DIAGNOSIS — R627 Adult failure to thrive: Secondary | ICD-10-CM | POA: Diagnosis present

## 2023-09-10 DIAGNOSIS — T83511A Infection and inflammatory reaction due to indwelling urethral catheter, initial encounter: Secondary | ICD-10-CM | POA: Diagnosis not present

## 2023-09-10 DIAGNOSIS — Z66 Do not resuscitate: Secondary | ICD-10-CM | POA: Diagnosis present

## 2023-09-10 DIAGNOSIS — K449 Diaphragmatic hernia without obstruction or gangrene: Secondary | ICD-10-CM | POA: Diagnosis not present

## 2023-09-10 DIAGNOSIS — L89322 Pressure ulcer of left buttock, stage 2: Secondary | ICD-10-CM | POA: Diagnosis present

## 2023-09-10 DIAGNOSIS — N390 Urinary tract infection, site not specified: Secondary | ICD-10-CM | POA: Diagnosis present

## 2023-09-10 DIAGNOSIS — E876 Hypokalemia: Secondary | ICD-10-CM | POA: Diagnosis present

## 2023-09-10 DIAGNOSIS — R9389 Abnormal findings on diagnostic imaging of other specified body structures: Secondary | ICD-10-CM | POA: Diagnosis not present

## 2023-09-10 DIAGNOSIS — E870 Hyperosmolality and hypernatremia: Secondary | ICD-10-CM | POA: Diagnosis present

## 2023-09-10 DIAGNOSIS — D638 Anemia in other chronic diseases classified elsewhere: Secondary | ICD-10-CM | POA: Diagnosis present

## 2023-09-10 DIAGNOSIS — E785 Hyperlipidemia, unspecified: Secondary | ICD-10-CM | POA: Diagnosis present

## 2023-09-10 DIAGNOSIS — I10 Essential (primary) hypertension: Secondary | ICD-10-CM | POA: Diagnosis present

## 2023-09-10 DIAGNOSIS — M6282 Rhabdomyolysis: Secondary | ICD-10-CM | POA: Diagnosis present

## 2023-09-10 DIAGNOSIS — L8990 Pressure ulcer of unspecified site, unspecified stage: Secondary | ICD-10-CM | POA: Insufficient documentation

## 2023-09-10 DIAGNOSIS — H409 Unspecified glaucoma: Secondary | ICD-10-CM | POA: Diagnosis not present

## 2023-09-10 DIAGNOSIS — F330 Major depressive disorder, recurrent, mild: Secondary | ICD-10-CM | POA: Diagnosis not present

## 2023-09-10 DIAGNOSIS — M1990 Unspecified osteoarthritis, unspecified site: Secondary | ICD-10-CM | POA: Insufficient documentation

## 2023-09-10 DIAGNOSIS — L89002 Pressure ulcer of unspecified elbow, stage 2: Secondary | ICD-10-CM | POA: Diagnosis not present

## 2023-09-10 DIAGNOSIS — R140 Abdominal distension (gaseous): Secondary | ICD-10-CM | POA: Diagnosis present

## 2023-09-10 DIAGNOSIS — M549 Dorsalgia, unspecified: Secondary | ICD-10-CM | POA: Diagnosis present

## 2023-09-10 DIAGNOSIS — G8929 Other chronic pain: Secondary | ICD-10-CM | POA: Diagnosis present

## 2023-09-10 DIAGNOSIS — R197 Diarrhea, unspecified: Secondary | ICD-10-CM | POA: Diagnosis present

## 2023-09-10 DIAGNOSIS — N281 Cyst of kidney, acquired: Secondary | ICD-10-CM | POA: Diagnosis not present

## 2023-09-10 DIAGNOSIS — R2681 Unsteadiness on feet: Secondary | ICD-10-CM | POA: Diagnosis not present

## 2023-09-10 DIAGNOSIS — E440 Moderate protein-calorie malnutrition: Secondary | ICD-10-CM | POA: Diagnosis present

## 2023-09-10 DIAGNOSIS — Z634 Disappearance and death of family member: Secondary | ICD-10-CM

## 2023-09-10 DIAGNOSIS — M1991 Primary osteoarthritis, unspecified site: Secondary | ICD-10-CM | POA: Diagnosis not present

## 2023-09-10 DIAGNOSIS — K8689 Other specified diseases of pancreas: Secondary | ICD-10-CM | POA: Diagnosis not present

## 2023-09-10 DIAGNOSIS — K219 Gastro-esophageal reflux disease without esophagitis: Secondary | ICD-10-CM | POA: Insufficient documentation

## 2023-09-10 DIAGNOSIS — D649 Anemia, unspecified: Secondary | ICD-10-CM | POA: Diagnosis not present

## 2023-09-10 DIAGNOSIS — E860 Dehydration: Secondary | ICD-10-CM | POA: Diagnosis present

## 2023-09-10 DIAGNOSIS — E782 Mixed hyperlipidemia: Secondary | ICD-10-CM | POA: Insufficient documentation

## 2023-09-10 DIAGNOSIS — Y846 Urinary catheterization as the cause of abnormal reaction of the patient, or of later complication, without mention of misadventure at the time of the procedure: Secondary | ICD-10-CM | POA: Diagnosis not present

## 2023-09-10 DIAGNOSIS — Z79891 Long term (current) use of opiate analgesic: Secondary | ICD-10-CM

## 2023-09-10 DIAGNOSIS — F32A Depression, unspecified: Secondary | ICD-10-CM | POA: Diagnosis present

## 2023-09-10 DIAGNOSIS — G9341 Metabolic encephalopathy: Secondary | ICD-10-CM | POA: Diagnosis present

## 2023-09-10 DIAGNOSIS — E669 Obesity, unspecified: Secondary | ICD-10-CM | POA: Diagnosis not present

## 2023-09-10 DIAGNOSIS — Z7401 Bed confinement status: Secondary | ICD-10-CM | POA: Diagnosis not present

## 2023-09-10 DIAGNOSIS — N179 Acute kidney failure, unspecified: Secondary | ICD-10-CM | POA: Diagnosis not present

## 2023-09-10 DIAGNOSIS — Z79899 Other long term (current) drug therapy: Secondary | ICD-10-CM

## 2023-09-10 DIAGNOSIS — E039 Hypothyroidism, unspecified: Secondary | ICD-10-CM | POA: Diagnosis present

## 2023-09-10 DIAGNOSIS — R404 Transient alteration of awareness: Secondary | ICD-10-CM | POA: Diagnosis not present

## 2023-09-10 DIAGNOSIS — Z6833 Body mass index (BMI) 33.0-33.9, adult: Secondary | ICD-10-CM

## 2023-09-10 DIAGNOSIS — B9620 Unspecified Escherichia coli [E. coli] as the cause of diseases classified elsewhere: Secondary | ICD-10-CM | POA: Diagnosis present

## 2023-09-10 DIAGNOSIS — K4020 Bilateral inguinal hernia, without obstruction or gangrene, not specified as recurrent: Secondary | ICD-10-CM | POA: Diagnosis not present

## 2023-09-10 DIAGNOSIS — L98499 Non-pressure chronic ulcer of skin of other sites with unspecified severity: Secondary | ICD-10-CM

## 2023-09-10 DIAGNOSIS — I878 Other specified disorders of veins: Secondary | ICD-10-CM | POA: Diagnosis not present

## 2023-09-10 DIAGNOSIS — E66811 Obesity, class 1: Secondary | ICD-10-CM | POA: Diagnosis present

## 2023-09-10 DIAGNOSIS — K838 Other specified diseases of biliary tract: Secondary | ICD-10-CM | POA: Diagnosis not present

## 2023-09-10 DIAGNOSIS — Z79890 Hormone replacement therapy (postmenopausal): Secondary | ICD-10-CM

## 2023-09-10 DIAGNOSIS — M6281 Muscle weakness (generalized): Secondary | ICD-10-CM | POA: Diagnosis not present

## 2023-09-10 DIAGNOSIS — F339 Major depressive disorder, recurrent, unspecified: Secondary | ICD-10-CM | POA: Diagnosis not present

## 2023-09-10 DIAGNOSIS — Z791 Long term (current) use of non-steroidal anti-inflammatories (NSAID): Secondary | ICD-10-CM

## 2023-09-10 LAB — TSH: TSH: 2.854 u[IU]/mL (ref 0.350–4.500)

## 2023-09-10 LAB — URINALYSIS, ROUTINE W REFLEX MICROSCOPIC
Bilirubin Urine: NEGATIVE
Glucose, UA: NEGATIVE mg/dL
Hgb urine dipstick: NEGATIVE
Ketones, ur: NEGATIVE mg/dL
Leukocytes,Ua: NEGATIVE
Nitrite: NEGATIVE
Protein, ur: NEGATIVE mg/dL
Specific Gravity, Urine: 1.015 (ref 1.005–1.030)
pH: 5 (ref 5.0–8.0)

## 2023-09-10 LAB — TROPONIN I (HIGH SENSITIVITY): Troponin I (High Sensitivity): 21 ng/L — ABNORMAL HIGH (ref <18)

## 2023-09-10 LAB — COMPREHENSIVE METABOLIC PANEL WITH GFR
ALT: 22 U/L (ref 0–44)
AST: 34 U/L (ref 15–41)
Albumin: 2.9 g/dL — ABNORMAL LOW (ref 3.5–5.0)
Alkaline Phosphatase: 96 U/L (ref 38–126)
Anion gap: 15 (ref 5–15)
BUN: 77 mg/dL — ABNORMAL HIGH (ref 8–23)
CO2: 22 mmol/L (ref 22–32)
Calcium: 9.1 mg/dL (ref 8.9–10.3)
Chloride: 98 mmol/L (ref 98–111)
Creatinine, Ser: 2.41 mg/dL — ABNORMAL HIGH (ref 0.44–1.00)
GFR, Estimated: 19 mL/min — ABNORMAL LOW (ref >60)
Glucose, Bld: 114 mg/dL — ABNORMAL HIGH (ref 70–99)
Potassium: 3.5 mmol/L (ref 3.5–5.1)
Sodium: 135 mmol/L (ref 135–145)
Total Bilirubin: 0.6 mg/dL (ref 0.0–1.2)
Total Protein: 6.4 g/dL — ABNORMAL LOW (ref 6.5–8.1)

## 2023-09-10 LAB — CBC
HCT: 36 % (ref 36.0–46.0)
Hemoglobin: 11.9 g/dL — ABNORMAL LOW (ref 12.0–15.0)
MCH: 30.8 pg (ref 26.0–34.0)
MCHC: 33.1 g/dL (ref 30.0–36.0)
MCV: 93.3 fL (ref 80.0–100.0)
Platelets: 502 K/uL — ABNORMAL HIGH (ref 150–400)
RBC: 3.86 MIL/uL — ABNORMAL LOW (ref 3.87–5.11)
RDW: 13.5 % (ref 11.5–15.5)
WBC: 12 K/uL — ABNORMAL HIGH (ref 4.0–10.5)
nRBC: 0 % (ref 0.0–0.2)

## 2023-09-10 LAB — CBG MONITORING, ED: Glucose-Capillary: 101 mg/dL — ABNORMAL HIGH (ref 70–99)

## 2023-09-10 LAB — IRON AND TIBC
Iron: 16 ug/dL — ABNORMAL LOW (ref 28–170)
Saturation Ratios: 9 % — ABNORMAL LOW (ref 10.4–31.8)
TIBC: 189 ug/dL — ABNORMAL LOW (ref 250–450)
UIBC: 173 ug/dL

## 2023-09-10 LAB — CK: Total CK: 429 U/L — ABNORMAL HIGH (ref 38–234)

## 2023-09-10 LAB — VITAMIN D 25 HYDROXY (VIT D DEFICIENCY, FRACTURES): Vit D, 25-Hydroxy: 36.52 ng/mL (ref 30–100)

## 2023-09-10 LAB — VITAMIN B12: Vitamin B-12: 1137 pg/mL — ABNORMAL HIGH (ref 180–914)

## 2023-09-10 LAB — FOLATE: Folate: 15.7 ng/mL (ref >5.9)

## 2023-09-10 LAB — MAGNESIUM: Magnesium: 1.9 mg/dL (ref 1.7–2.4)

## 2023-09-10 LAB — PHOSPHORUS: Phosphorus: 3.1 mg/dL (ref 2.5–4.6)

## 2023-09-10 LAB — FERRITIN: Ferritin: 138 ng/mL (ref 11–307)

## 2023-09-10 MED ORDER — SIMVASTATIN 20 MG PO TABS
20.0000 mg | ORAL_TABLET | Freq: Every evening | ORAL | Status: DC
Start: 2023-09-10 — End: 2023-09-18
  Administered 2023-09-11 – 2023-09-17 (×7): 20 mg via ORAL
  Filled 2023-09-10 (×7): qty 1

## 2023-09-10 MED ORDER — LACTATED RINGERS IV BOLUS
1000.0000 mL | Freq: Once | INTRAVENOUS | Status: AC
  Administered 2023-09-10: 1000 mL via INTRAVENOUS

## 2023-09-10 MED ORDER — OXYCODONE HCL 5 MG PO TABS
5.0000 mg | ORAL_TABLET | Freq: Four times a day (QID) | ORAL | Status: DC | PRN
  Administered 2023-09-13 – 2023-09-15 (×4): 5 mg via ORAL
  Filled 2023-09-10 (×4): qty 1

## 2023-09-10 MED ORDER — OXYCODONE-ACETAMINOPHEN 5-325 MG PO TABS
1.0000 | ORAL_TABLET | Freq: Four times a day (QID) | ORAL | Status: DC | PRN
  Administered 2023-09-13 – 2023-09-15 (×4): 1 via ORAL
  Filled 2023-09-10 (×4): qty 1

## 2023-09-10 MED ORDER — CITALOPRAM HYDROBROMIDE 20 MG PO TABS
40.0000 mg | ORAL_TABLET | Freq: Every day | ORAL | Status: DC
Start: 2023-09-10 — End: 2023-09-18
  Administered 2023-09-11 – 2023-09-18 (×8): 40 mg via ORAL
  Filled 2023-09-10 (×8): qty 2

## 2023-09-10 MED ORDER — HYDRALAZINE HCL 20 MG/ML IJ SOLN: 10.0000 mg | Freq: Three times a day (TID) | INTRAMUSCULAR | Status: DC | PRN

## 2023-09-10 MED ORDER — SENNOSIDES-DOCUSATE SODIUM 8.6-50 MG PO TABS
1.0000 | ORAL_TABLET | Freq: Every day | ORAL | Status: DC
  Administered 2023-09-16 – 2023-09-17 (×2): 1 via ORAL
  Filled 2023-09-10 (×3): qty 1

## 2023-09-10 MED ORDER — ONDANSETRON HCL 4 MG/2ML IJ SOLN: 4.0000 mg | Freq: Four times a day (QID) | INTRAMUSCULAR | Status: DC | PRN

## 2023-09-10 MED ORDER — BISACODYL 5 MG PO TBEC: 5.0000 mg | DELAYED_RELEASE_TABLET | Freq: Every day | ORAL | Status: DC | PRN

## 2023-09-10 MED ORDER — OXYCODONE-ACETAMINOPHEN 10-325 MG PO TABS: 1.0000 | ORAL_TABLET | Freq: Four times a day (QID) | ORAL | Status: DC

## 2023-09-10 MED ORDER — HEPARIN SODIUM (PORCINE) 5000 UNIT/ML IJ SOLN
5000.0000 [IU] | Freq: Three times a day (TID) | INTRAMUSCULAR | Status: DC
  Administered 2023-09-10 – 2023-09-18 (×24): 5000 [IU] via SUBCUTANEOUS
  Filled 2023-09-10 (×24): qty 1

## 2023-09-10 MED ORDER — SODIUM CHLORIDE 0.9 % IV SOLN: INTRAVENOUS | Status: AC

## 2023-09-10 MED ORDER — ENSURE PLUS HIGH PROTEIN PO LIQD
237.0000 mL | Freq: Two times a day (BID) | ORAL | Status: DC
  Administered 2023-09-10 – 2023-09-16 (×10): 237 mL via ORAL
  Filled 2023-09-10 (×2): qty 237

## 2023-09-10 MED ORDER — ACETAMINOPHEN 650 MG RE SUPP
650.0000 mg | Freq: Four times a day (QID) | RECTAL | Status: DC | PRN
Start: 2023-09-10 — End: 2023-09-10

## 2023-09-10 MED ORDER — SODIUM CHLORIDE 0.9 % IV BOLUS
1000.0000 mL | Freq: Once | INTRAVENOUS | Status: AC
  Administered 2023-09-10: 1000 mL via INTRAVENOUS

## 2023-09-10 MED ORDER — LEVOTHYROXINE SODIUM 75 MCG PO TABS
150.0000 ug | ORAL_TABLET | Freq: Every day | ORAL | Status: DC
  Administered 2023-09-11 – 2023-09-18 (×8): 150 ug via ORAL
  Filled 2023-09-10 (×9): qty 2

## 2023-09-10 MED ORDER — ONDANSETRON HCL 4 MG PO TABS: 4.0000 mg | ORAL_TABLET | Freq: Four times a day (QID) | ORAL | Status: DC | PRN

## 2023-09-10 MED ORDER — ACETAMINOPHEN 325 MG PO TABS: 650.0000 mg | ORAL_TABLET | Freq: Four times a day (QID) | ORAL | Status: DC | PRN

## 2023-09-10 MED ORDER — ACETAMINOPHEN 500 MG PO TABS
1000.0000 mg | ORAL_TABLET | Freq: Three times a day (TID) | ORAL | Status: DC
  Administered 2023-09-11 – 2023-09-18 (×19): 1000 mg via ORAL
  Filled 2023-09-10 (×23): qty 2

## 2023-09-10 MED ORDER — SENNOSIDES-DOCUSATE SODIUM 8.6-50 MG PO TABS: 1.0000 | ORAL_TABLET | Freq: Every evening | ORAL | Status: DC | PRN

## 2023-09-10 MED ORDER — ALPRAZOLAM 0.5 MG PO TABS
0.2500 mg | ORAL_TABLET | Freq: Three times a day (TID) | ORAL | Status: DC | PRN
  Administered 2023-09-12 – 2023-09-17 (×7): 0.5 mg via ORAL
  Filled 2023-09-10 (×7): qty 1

## 2023-09-10 NOTE — ED Notes (Signed)
One set of blood cultures sent to lab.  

## 2023-09-10 NOTE — ED Triage Notes (Addendum)
 Pt bib PTAR from home; lives alone, LSN reported by ems to be approx 2 days ago; son checked on pt this am, found her sitting in chair; pt unable to get up from chair due to generalized weakness; stroke screen negative qwith ems; endorses poor PO intake; husband died 4 months ago; maoloderous urine noted; denies fevers; also having diarrhea x 2 weeks; ems reports pt took a perocet and 1/2 of a xanax  around 0500 this am; mottling to bilateral feet

## 2023-09-10 NOTE — H&P (Addendum)
 History and Physical  Megan Harmon FMW:[id number] DOB: 1932/03/19 DOA: 09/10/2023  PCP: Loreli Kins, MD   Chief Complaint: Generalized weakness  HPI: Megan Harmon is a 89 y.o. female with medical history significant for anxiety and depression, osteoarthritis, hypertension and hypothyroidism who presented to the ED for evaluation of generalized weakness. Per son, patient lost her spouse 4 months ago as she has had gradual decline in health with good and bad days. Patient currently lives alone and ambulates with a walker with family close by and monitoring closely via camera. Over the last 2 days, they have noticed patient has had significantly decrease in her mobility. Around 3 AM this morning, they noticed that patient was sitting on something and unable to get up. Son reports that when he checked on patient, she had been sitting on the bottom of her lift chair for hours and had significant difficulty getting up or moving her feet.  Patient has also had recent decrease food and water intake as well as watery stools over the last 2 weeks. Patient endorses poor appetite and generalized weakness/fatigue but denies any fevers, chills, abdominal pain, nausea, vomiting, chest pain, shortness of breath or leg swelling. Patient does have a small callus from an ulcer on the lateral aspect of her left foot that is being followed by podiatry. Patient with mild confusion but able to tell me her full name, DOB and current location.  ED Course: Initial vitals show patient afebrile, slightly hypertensive with SBP in the 130s to 150s. Initial labs significant for creatinine 2.41, WBC 12.0, Hgb 11.9, platelet 502, UA with no signs of infection. EKG shows sinus rhythm. CXR shows no active disease. Pt received IV LR 1 L bolus. [facility] was consulted for admission.   Review of Systems: Please see HPI for pertinent positives and negatives. A complete 10 system review of systems are otherwise negative.  Past  Medical History:  Diagnosis Date   Arthritis    Hypertension    History reviewed. No pertinent surgical history. Social History:  reports that she has never smoked. She has never used smokeless tobacco. She reports that she does not drink alcohol and does not use drugs.  No Known Allergies  History reviewed. No pertinent family history.   Prior to Admission medications   Medication Sig Start Date End Date Taking? Authorizing Provider  ALPRAZolam  (XANAX ) 0.5 MG tablet Take 0.5-1 tablets (0.25-0.5 mg total) by mouth 2 (two) times daily as needed. 09/17/22     ALPRAZolam  (XANAX ) 0.5 MG tablet Take 0.5-1 tablets (0.25-0.5 mg total) by mouth 2 (two) times daily as needed. 11/19/22     ALPRAZolam  (XANAX ) 0.5 MG tablet Take 0.5-1 tablets (0.25-0.5 mg total) by mouth 2 (two) times daily as needed. 01/27/23     ALPRAZolam  (XANAX ) 0.5 MG tablet Take 0.5-1 tablet (0.25-0.5 mg total) by mouth 2 (two) times daily. 02/26/23     ALPRAZolam  (XANAX ) 0.5 MG tablet Take 0.5-1 tablets (0.25-0.5 mg total) by mouth 3 (three) times daily as needed for anxiety. 08/24/23     amLODipine (NORVASC) 10 MG tablet Take 10 mg by mouth daily.      [provider]  benazepril  (LOTENSIN ) 40 MG tablet Take 1 tablet (40 mg total) by mouth daily. 07/08/23     bimatoprost (LUMIGAN) 0.01 % SOLN Place 1 drop into both eyes daily.      [provider]  calcium carbonate (OS-CAL - DOSED IN MG OF ELEMENTAL CALCIUM) 1250 MG tablet Take 1 tablet  by mouth daily.      [provider]  citalopram  (CELEXA ) 40 MG tablet Take 1 tablet (40 mg total) by mouth daily. 05/07/23     dorzolamide-timolol (COSOPT) 22.3-6.8 MG/ML ophthalmic solution Place 1 drop into both eyes 2 (two) times daily.      [provider]  doxycycline  (MONODOX ) 100 MG capsule Take 1 capsule (100 mg total) by mouth 2 (two) times daily for 10 days. 09/04/22     furosemide  (LASIX ) 20 MG tablet Take 1 tablet (20 mg total) by mouth daily as needed.  07/08/23     Glucosamine-Chondroit-Vit C-Mn (GLUCOSAMINE 1500 COMPLEX PO) Take 1 tablet by mouth daily.      [provider]  hydrochlorothiazide  (HYDRODIURIL ) 25 MG tablet Take 1 tablet (25 mg total) by mouth every morning. 07/08/23     latanoprost (XALATAN) 0.005 % ophthalmic solution Place 1 drop into both eyes at bedtime.      [provider]  levothyroxine  (SYNTHROID ) 150 MCG tablet Take 1 tablet (150 mcg total) by mouth every morning on an empty stomach. 01/20/22     meloxicam  (MOBIC ) 15 MG tablet Take 1 tablet (15 mg total) by mouth daily. 07/05/23     mupirocin  ointment (BACTROBAN ) 2 % Apply 1 Application topically 2 (two) times daily. 11/03/22   McDonald, Juliene SAUNDERS, DPM  omeprazole  (PRILOSEC) 40 MG capsule Take 1 capsule (40 mg total) by mouth 2 (two) times daily, with first dose 30 minutes before morning meal. 03/24/23     oxyCODONE -acetaminophen  (PERCOCET) 10-325 MG per tablet Take 1 tablet by mouth every 4 (four) hours as needed. For pain     [provider]  oxyCODONE -acetaminophen  (PERCOCET) 10-325 MG tablet Take 1 tablet by mouth every 6 (six) hours as needed. 03/29/23     oxyCODONE -acetaminophen  (PERCOCET) 10-325 MG tablet Take 1 tablet by mouth every 6 (six) hours. 05/28/23     oxyCODONE -acetaminophen  (PERCOCET) 10-325 MG tablet Take 1 tablet by mouth every 6 (six) hours as needed. 06/25/23     oxyCODONE -acetaminophen  (PERCOCET) 10-325 MG tablet Take 1 tablet by mouth every 6 (six) hours. 08/24/23     oxyCODONE -acetaminophen  (PERCOCET) 7.5-325 MG tablet Take 1 tablet by mouth every 6 (six) hours as needed. 07/15/22     oxyCODONE -acetaminophen  (PERCOCET) 7.5-325 MG tablet Take 1 tablet by mouth every 6 (six) hours as needed. 08/12/22     oxyCODONE -acetaminophen  (PERCOCET) 7.5-325 MG tablet Take 1 tablet by mouth every 6 (six) hours as needed. 09/17/22     oxyCODONE -acetaminophen  (PERCOCET) 7.5-325 MG tablet Take 1 tablet by mouth every 6 (six) hours as needed. 10/19/22      oxyCODONE -acetaminophen  (PERCOCET) 7.5-325 MG tablet Take 1 tablet by mouth every 6 (six) hours as needed. 11/19/22     oxyCODONE -acetaminophen  (PERCOCET) 7.5-325 MG tablet Take 1 tablet by mouth every 6 (six) hours as needed. 01/27/23     oxyCODONE -acetaminophen  (PERCOCET) 7.5-325 MG tablet Take 1 tablet by mouth every 6 (six) hours. 02/26/23     oxyCODONE -acetaminophen  (PERCOCET/ROXICET) 5-325 MG tablet Take 1 to 1 and 1/2 tablets by mouth every 6 hrs as needed 01/20/22     oxyCODONE -acetaminophen  (PERCOCET/ROXICET) 5-325 MG tablet Take 1-1.5 tablets by mouth every 6 (six) hours as needed. 02/25/22     silver  sulfADIAZINE  (SILVADENE ) 1 % cream Apply 1 Application topically daily. 09/18/22   Gershon Donnice SAUNDERS, DPM  simvastatin  (ZOCOR ) 20 MG tablet Take 1 tablet (20 mg total) by mouth every evening. 08/13/23  Physical Exam: BP (!) 150/64   Pulse 77   Temp 98 F (36.7 C) (Oral)   Resp 16   Ht 5' 6 (1.676 m)   Wt 95.3 kg   SpO2 100%   BMI 33.89 kg/m  General: Pleasant, weak-appearing elderly woman laying in bed. No acute distress. HEENT: [place]/AT. Anicteric sclera.  Dry mucous membrane CV: RRR. No murmurs, rubs, or gallops. No LE edema Pulmonary: Lungs CTAB. Normal effort. No wheezing or rales. Abdominal: Soft, moderate distention. Nontender. Normal bowel sounds. Extremities: Faint pedal pulses. Callus on lateral aspect of left small toe Skin: Warm and dry. No obvious rash or lesions. Decreased skin turgor. Purplish discoloration of the plantar aspect of the foot (chronic per son) Neuro: Alert and oriented to self, person and place but not to time or situation. Generalized weakness. Moves all extremities. Normal sensation to light touch. No focal deficit. Psych: Normal mood and affect          Labs on Admission:  Basic Metabolic Panel: Recent Labs  Lab 09/10/23 1041  NA 135  K 3.5  CL 98  CO2 22  GLUCOSE 114*  BUN 77*  CREATININE 2.41*  CALCIUM 9.1   Liver Function  Tests: Recent Labs  Lab 09/10/23 1041  AST 34  ALT 22  ALKPHOS 96  BILITOT 0.6  PROT 6.4*  ALBUMIN 2.9*   No results for input(s): LIPASE, AMYLASE in the last 168 hours. No results for input(s): AMMONIA in the last 168 hours. CBC: Recent Labs  Lab 09/10/23 1041  WBC 12.0*  HGB 11.9*  HCT 36.0  MCV 93.3  PLT 502*   Cardiac Enzymes: No results for input(s): CKTOTAL, CKMB, CKMBINDEX, TROPONINI in the last 168 hours. BNP (last 3 results) No results for input(s): BNP in the last 8760 hours.  ProBNP (last 3 results) No results for input(s): PROBNP in the last 8760 hours.  CBG: Recent Labs  Lab 09/10/23 1042  GLUCAP 101*    Radiological Exams on Admission: DG Chest Port 1 View Result Date: 09/10/2023 CLINICAL DATA:  Weakness. EXAM: PORTABLE CHEST 1 VIEW COMPARISON:  January 22, 2020. FINDINGS: The heart size and mediastinal contours are within normal limits. Both lungs are clear. Elevated left hemidiaphragm is noted. Degenerative changes are seen involving both glenohumeral joints. IMPRESSION: No active disease. Electronically Signed   By: Lynwood Landy Raddle M.D.   On: 09/10/2023 11:03   Assessment/Plan Megan Harmon is a 89 y.o. female with medical history significant for anxiety and depression, osteoarthritis, hypertension and hypothyroidism who presented to the ED for evaluation of generalized weakness and decreased p.o. intake and admitted for AKI  # AKI - Creatinine elevated to 2.41, from normal baseline - Likely secondary to combination of dehydration in the setting of poor p.o. intake and ACEi, HCTZ and Lasix  use - Start IVNS at 150 cc/hr - Follow-up renal ultrasound - Trend renal function - Avoid nephrotoxic meds  # Generalized weakness # Failure to thrive - Elderly patient who recently lost her husband, presented with recent decreased p.o. intake and generalized weakness - No evidence of acute infection, this is likely in the setting of  grieving her husband - PT/OT eval and treat - Start protein supplementation - Check mag, Phos, CK and vitamin D   # HTN - BP elevated with SBP in the 130s to 160s - Home regimen includes HCTZ 25 mg daily, benazepril  40 mg daily and furosemide  20 mg daily. - Hold all BP meds for now in the  setting of AKI - IV hydralazine  PRN for SBP > 180  # Anemia - Patient with Hgb of 11.9 from around 14.2 12 years ago - No evidence of active bleed or bruising - Check iron studies, ferritin, vitamin B12 and folate - Trend CBC and transfuse for hgb > 7  # Abdominal distention - Patient found to have moderate abdominal distention on exam but no abdominal tenderness.  - Concern for possible urinary retention versus constipation - Bladder scan followed by In-N-Out cath - Check KUB  # Hypothyroidism - Follow-up TSH - Continue on Synthroid   # Arthritis - Per son, patient has severe arthritis in her shoulders managed with as needed Percocet - Scheduled Tylenol  3 times daily and continue as needed Percocet  # Anxiety and depression - Patient currently grieving her husband who passed away 4 months ago - Continue Celexa  and as needed Xanax  - Chaplain consulted for visitation and support  # HLD - Continue simvastatin   DVT prophylaxis: Heparin     Code Status: Full Code  Consults called: None  Family Communication: Discussed admission with son and daughter-in-law at bedside  Severity of Illness: The appropriate patient status for this patient is INPATIENT. Inpatient status is judged to be reasonable and necessary in order to provide the required intensity of service to ensure the patient's safety. The patient's presenting symptoms, physical exam findings, and initial radiographic and laboratory data in the context of their chronic comorbidities is felt to place them at high risk for further clinical deterioration. Furthermore, it is not anticipated that the patient will be medically stable for  discharge from the hospital within 2 midnights of admission.   * I certify that at the point of admission it is my clinical judgment that the patient will require inpatient hospital care spanning beyond 2 midnights from the point of admission due to high intensity of service, high risk for further deterioration and high frequency of surveillance required.*  Level of care: Med-Surg   This record has been created using Conservation officer, historic buildings. Errors have been sought and corrected, but may not always be located. Such creation errors do not reflect on the standard of care.   Lou Claretta HERO, MD 09/10/2023, 2:28 PM [facility] Pager: [phone] Isaiah 41:10   If 7PM-7AM, please contact night-coverage [email or web address] Password [email or web address]

## 2023-09-10 NOTE — ED Notes (Signed)
 5C notified of patient coming up

## 2023-09-10 NOTE — Plan of Care (Signed)
 I was asked to evaluate this patient at bedside by the admitting physician after abdominal x-ray showed changes concerning for colonic ileus or distal colonic obstruction.  Sigmoid volvulus not excluded.  Patient is resting in bed, asleep but easily arouses to voice.  She is only answering some questions appropriately.  She knows she is in the hospital but has difficulty telling me when her last bowel movement was or if she has had any recent nausea, vomiting.  She is denying abdominal pain.  Abdomen is distended with masslike protrusion at the right periumbilical area.  She is not obviously tender with palpation, not guarding.  Agree with CT scan which admitting provider has ordered.  Will make strict n.p.o., continue IV fluids as ordered.  Jorie Blanch, MD [facility]

## 2023-09-10 NOTE — ED Provider Notes (Addendum)
 [facility] AT [facility] Provider Note   CSN: [id number] Arrival date & time: 09/10/23  1033     Patient presents with: Weakness   Megan Harmon is a 89 y.o. female.   Pt presents via EMS from home with report of generalized weakness, decreased po intake, recent diarrhea, for past 1-2 weeks. No report of trauma/fall. No report of fevers. Pt limited historian, level 5 caveat - pt denies specific complaint. Denies focal or unilateral numbness/weakness. No change in speech or vision. No chest pain or sob. No cough or uri symptoms. No gu c/o. No focal extremity pain/swelling.   The history is provided by the patient, medical records and the EMS personnel. The history is limited by the condition of the patient.       Prior to Admission medications   Medication Sig Start Date End Date Taking? Authorizing Provider  ALPRAZolam  (XANAX ) 0.5 MG tablet Take 0.5-1 tablets (0.25-0.5 mg total) by mouth 2 (two) times daily as needed. 09/17/22     ALPRAZolam  (XANAX ) 0.5 MG tablet Take 0.5-1 tablets (0.25-0.5 mg total) by mouth 2 (two) times daily as needed. 11/19/22     ALPRAZolam  (XANAX ) 0.5 MG tablet Take 0.5-1 tablets (0.25-0.5 mg total) by mouth 2 (two) times daily as needed. 01/27/23     ALPRAZolam  (XANAX ) 0.5 MG tablet Take 0.5-1 tablet (0.25-0.5 mg total) by mouth 2 (two) times daily. 02/26/23     ALPRAZolam  (XANAX ) 0.5 MG tablet Take 0.5-1 tablets (0.25-0.5 mg total) by mouth 3 (three) times daily as needed for anxiety. 08/24/23     amLODipine (NORVASC) 10 MG tablet Take 10 mg by mouth daily.      [provider]  benazepril  (LOTENSIN ) 40 MG tablet Take 1 tablet (40 mg total) by mouth daily. 07/08/23     bimatoprost (LUMIGAN) 0.01 % SOLN Place 1 drop into both eyes daily.      [provider]  calcium carbonate (OS-CAL - DOSED IN MG OF ELEMENTAL CALCIUM) 1250 MG tablet Take 1 tablet by mouth daily.      [provider]  citalopram   (CELEXA ) 40 MG tablet Take 1 tablet (40 mg total) by mouth daily. 05/07/23     dorzolamide-timolol (COSOPT) 22.3-6.8 MG/ML ophthalmic solution Place 1 drop into both eyes 2 (two) times daily.      [provider]  doxycycline  (MONODOX ) 100 MG capsule Take 1 capsule (100 mg total) by mouth 2 (two) times daily for 10 days. 09/04/22     furosemide  (LASIX ) 20 MG tablet Take 1 tablet (20 mg total) by mouth daily as needed. 07/08/23     Glucosamine-Chondroit-Vit C-Mn (GLUCOSAMINE 1500 COMPLEX PO) Take 1 tablet by mouth daily.      [provider]  hydrochlorothiazide  (HYDRODIURIL ) 25 MG tablet Take 1 tablet (25 mg total) by mouth every morning. 07/08/23     latanoprost (XALATAN) 0.005 % ophthalmic solution Place 1 drop into both eyes at bedtime.      [provider]  levothyroxine  (SYNTHROID ) 150 MCG tablet Take 1 tablet (150 mcg total) by mouth every morning on an empty stomach. 01/20/22     meloxicam  (MOBIC ) 15 MG tablet Take 1 tablet (15 mg total) by mouth daily. 07/05/23     mupirocin  ointment (BACTROBAN ) 2 % Apply 1 Application topically 2 (two) times daily. 11/03/22   McDonald, Juliene SAUNDERS, DPM  omeprazole  (PRILOSEC) 40 MG capsule Take 1 capsule (40 mg total) by mouth 2 (two) times daily, with  first dose 30 minutes before morning meal. 03/24/23     oxyCODONE -acetaminophen  (PERCOCET) 10-325 MG per tablet Take 1 tablet by mouth every 4 (four) hours as needed. For pain     [provider]  oxyCODONE -acetaminophen  (PERCOCET) 10-325 MG tablet Take 1 tablet by mouth every 6 (six) hours as needed. 03/29/23     oxyCODONE -acetaminophen  (PERCOCET) 10-325 MG tablet Take 1 tablet by mouth every 6 (six) hours. 05/28/23     oxyCODONE -acetaminophen  (PERCOCET) 10-325 MG tablet Take 1 tablet by mouth every 6 (six) hours as needed. 06/25/23     oxyCODONE -acetaminophen  (PERCOCET) 10-325 MG tablet Take 1 tablet by mouth every 6 (six) hours. 08/24/23     oxyCODONE -acetaminophen  (PERCOCET) 7.5-325 MG  tablet Take 1 tablet by mouth every 6 (six) hours as needed. 07/15/22     oxyCODONE -acetaminophen  (PERCOCET) 7.5-325 MG tablet Take 1 tablet by mouth every 6 (six) hours as needed. 08/12/22     oxyCODONE -acetaminophen  (PERCOCET) 7.5-325 MG tablet Take 1 tablet by mouth every 6 (six) hours as needed. 09/17/22     oxyCODONE -acetaminophen  (PERCOCET) 7.5-325 MG tablet Take 1 tablet by mouth every 6 (six) hours as needed. 10/19/22     oxyCODONE -acetaminophen  (PERCOCET) 7.5-325 MG tablet Take 1 tablet by mouth every 6 (six) hours as needed. 11/19/22     oxyCODONE -acetaminophen  (PERCOCET) 7.5-325 MG tablet Take 1 tablet by mouth every 6 (six) hours as needed. 01/27/23     oxyCODONE -acetaminophen  (PERCOCET) 7.5-325 MG tablet Take 1 tablet by mouth every 6 (six) hours. 02/26/23     oxyCODONE -acetaminophen  (PERCOCET/ROXICET) 5-325 MG tablet Take 1 to 1 and 1/2 tablets by mouth every 6 hrs as needed 01/20/22     oxyCODONE -acetaminophen  (PERCOCET/ROXICET) 5-325 MG tablet Take 1-1.5 tablets by mouth every 6 (six) hours as needed. 02/25/22     silver  sulfADIAZINE  (SILVADENE ) 1 % cream Apply 1 Application topically daily. 09/18/22   Gershon Donnice SAUNDERS, DPM  simvastatin  (ZOCOR ) 20 MG tablet Take 1 tablet (20 mg total) by mouth every evening. 08/13/23       Allergies: Patient has no known allergies.    Review of Systems  Constitutional:  Negative for chills and fever.  HENT:  Negative for sore throat.   Eyes:  Negative for visual disturbance.  Respiratory:  Negative for cough and shortness of breath.   Cardiovascular:  Negative for chest pain.  Gastrointestinal:  Positive for diarrhea. Negative for abdominal pain and vomiting.  Genitourinary:  Negative for dysuria and flank pain.  Musculoskeletal:  Negative for back pain and neck pain.  Neurological:  Negative for speech difficulty and headaches.    Updated Vital Signs BP (!) 134/56   Pulse 75   Temp 97.9 F (36.6 C) (Oral)   Resp 16   Ht 1.676 m (5' 6)   Wt 95.3  kg   SpO2 100%   BMI 33.89 kg/m   Physical Exam Vitals and nursing note reviewed.  Constitutional:      Appearance: Normal appearance. She is well-developed.  HENT:     Head: Atraumatic.     Nose: Nose normal.     Mouth/Throat:     Mouth: Mucous membranes are moist.  Eyes:     General: No scleral icterus.    Conjunctiva/sclera: Conjunctivae normal.     Pupils: Pupils are equal, round, and reactive to light.  Neck:     Vascular: No carotid bruit.     Trachea: No tracheal deviation.     Comments: Trachea midline, thyroid  not grossly enlarged  or tender. No neck stiffness or rigidity.  Cardiovascular:     Rate and Rhythm: Normal rate and regular rhythm.     Pulses: Normal pulses.     Heart sounds: Normal heart sounds. No murmur heard.    No friction rub. No gallop.  Pulmonary:     Effort: Pulmonary effort is normal. No respiratory distress.     Breath sounds: Normal breath sounds.  Abdominal:     General: Bowel sounds are normal. There is no distension.     Palpations: Abdomen is soft.     Tenderness: There is no abdominal tenderness. There is no guarding.  Genitourinary:    Comments: No cva tenderness.  Musculoskeletal:        General: No swelling or tenderness.     Cervical back: Normal range of motion and neck supple. No rigidity. No muscular tenderness.     Comments: See attached photo. Purplish discoloration to bil feet, pt not sure how long feet have had similar appearance.   Skin:    General: Skin is warm and dry.     Findings: No rash.  Neurological:     Mental Status: She is alert.     Comments: Alert, speech normal. Poor historian, mild confusion - baseline not entirely clear. Motor/sens grossly intact bil.   Psychiatric:        Mood and Affect: Mood normal.     (all labs ordered are listed, but only abnormal results are displayed) Results for orders placed or performed during the hospital encounter of 09/10/23  Comprehensive metabolic panel with GFR    Collection Time: 09/10/23 10:41 AM  Result Value Ref Range   Sodium 135 135 - 145 mmol/L   Potassium 3.5 3.5 - 5.1 mmol/L   Chloride 98 98 - 111 mmol/L   CO2 22 22 - 32 mmol/L   Glucose, Bld 114 (H) 70 - 99 mg/dL   BUN 77 (H) 8 - 23 mg/dL   Creatinine, Ser 7.58 (H) 0.44 - 1.00 mg/dL   Calcium 9.1 8.9 - 89.6 mg/dL   Total Protein 6.4 (L) 6.5 - 8.1 g/dL   Albumin 2.9 (L) 3.5 - 5.0 g/dL   AST 34 15 - 41 U/L   ALT 22 0 - 44 U/L   Alkaline Phosphatase 96 38 - 126 U/L   Total Bilirubin 0.6 0.0 - 1.2 mg/dL   GFR, Estimated 19 (L) >60 mL/min   Anion gap 15 5 - 15  CBC   Collection Time: 09/10/23 10:41 AM  Result Value Ref Range   WBC 12.0 (H) 4.0 - 10.5 K/uL   RBC 3.86 (L) 3.87 - 5.11 MIL/uL   Hemoglobin 11.9 (L) 12.0 - 15.0 g/dL   HCT 63.9 63.9 - 53.9 %   MCV 93.3 80.0 - 100.0 fL   MCH 30.8 26.0 - 34.0 pg   MCHC 33.1 30.0 - 36.0 g/dL   RDW 86.4 88.4 - 84.4 %   Platelets 502 (H) 150 - 400 K/uL   nRBC 0.0 0.0 - 0.2 %  Troponin I (High Sensitivity)   Collection Time: 09/10/23 10:41 AM  Result Value Ref Range   Troponin I (High Sensitivity) 21 (H) <18 ng/L  CBG monitoring, ED   Collection Time: 09/10/23 10:42 AM  Result Value Ref Range   Glucose-Capillary 101 (H) 70 - 99 mg/dL  Urinalysis, Routine w reflex microscopic -Urine, Catheterized   Collection Time: 09/10/23 10:54 AM  Result Value Ref Range   Color, Urine YELLOW YELLOW  APPearance CLEAR CLEAR   Specific Gravity, Urine 1.015 1.005 - 1.030   pH 5.0 5.0 - 8.0   Glucose, UA NEGATIVE NEGATIVE mg/dL   Hgb urine dipstick NEGATIVE NEGATIVE   Bilirubin Urine NEGATIVE NEGATIVE   Ketones, ur NEGATIVE NEGATIVE mg/dL   Protein, ur NEGATIVE NEGATIVE mg/dL   Nitrite NEGATIVE NEGATIVE   Leukocytes,Ua NEGATIVE NEGATIVE     EKG: EKG Interpretation Date/Time:  Friday September 10 2023 10:37:57 EDT Ventricular Rate:  83 PR Interval:  155 QRS Duration:  87 QT Interval:  349 QTC Calculation: 410 R Axis:   -7  Text  Interpretation: Sinus rhythm Non-specific ST-t changes Confirmed by Bernard Drivers ([id number]) on 09/10/2023 10:39:54 AM  Radiology: [facility] Chest Port 1 View Result Date: 09/10/2023 CLINICAL DATA:  Weakness. EXAM: PORTABLE CHEST 1 VIEW COMPARISON:  January 22, 2020. FINDINGS: The heart size and mediastinal contours are within normal limits. Both lungs are clear. Elevated left hemidiaphragm is noted. Degenerative changes are seen involving both glenohumeral joints. IMPRESSION: No active disease. Electronically Signed   By: Lynwood Landy Raddle M.D.   On: 09/10/2023 11:03     Procedures   Medications Ordered in the ED  lactated ringers  bolus 1,000 mL (0 mLs Intravenous Stopped 09/10/23 1219)                                    Medical Decision Making Problems Addressed: AKI (acute kidney injury) [facility]): acute illness or injury with systemic symptoms that poses a threat to life or bodily functions Dehydration: acute illness or injury with systemic symptoms that poses a threat to life or bodily functions Failure to thrive in adult: chronic illness or injury with exacerbation, progression, or side effects of treatment that poses a threat to life or bodily functions Generalized weakness: acute illness or injury with systemic symptoms that poses a threat to life or bodily functions Moderate protein-calorie malnutrition (HCC): chronic illness or injury with exacerbation, progression, or side effects of treatment that poses a threat to life or bodily functions  Amount and/or Complexity of Data Reviewed Independent Historian: EMS    Details: Ems/fam, hx External Data Reviewed: labs and notes. Labs: ordered. Decision-making details documented in ED Course. Radiology: ordered and independent interpretation performed. Decision-making details documented in ED Course. ECG/medicine tests: ordered and independent interpretation performed. Decision-making details documented in ED Course. Discussion of management or test  interpretation with external provider(s): medicine  Risk Decision regarding hospitalization.   Iv ns. Continuous pulse ox and cardiac monitoring. Labs ordered/sent. Imaging ordered.   Differential diagnosis includes dehydration, aki, uti, etc. Dispo decision including potential need for admission considered - will get labs and imaging and reassess.   Reviewed nursing notes and prior charts for additional history. External reports reviewed. Additional history from:  Cardiac monitor: sinus rhythm, rate 80.  Labs reviewed/interpreted by me - wbc and hgb 12. Chem w siegfried (although last bun/cr to compare is many years ago) elev bun/cr. K normal. Trop sl elev. No chest pain or discomfort. UA neg for uti.   Additional ivf bolus.   Xrays reviewed/interpreted by me - no pna.   Hospitalists consulted for admission.          Final diagnoses:  Generalized weakness  Dehydration  AKI (acute kidney injury) (HCC)  Failure to thrive in adult    ED Discharge Orders     None  Bernard Drivers, MD 09/10/23 1310

## 2023-09-10 NOTE — TOC CM/SW Note (Signed)
 Transition of Care [facility]) - Inpatient Brief Assessment   Patient Details  Name: Megan Harmon MRN: [id number] Date of Birth: 1932/11/20  Transition of Care [facility] CM/SW Contact:    Lauraine FORBES Saa, LCSWA Phone Number: 09/10/2023, 4:10 PM   Clinical Narrative:  4:10 PM Per chart review, patient resides at home alone. Patient has a PCP and insurance. Patient does not have SNF/HH/DME history. Patient's preferred pharmacy's are [facility] and [facility] [phone]. No TOC needs were identified at this time. TOC will continue to follow and be available to assist.  Transition of Care Asessment: Insurance and Status: Insurance coverage has been reviewed Patient has primary care physician: Yes Home environment has been reviewed: Private Residence Prior level of function:: N/A Prior/Current Home Services: No current home services Social Drivers of Health Review: SDOH reviewed no interventions necessary Readmission risk has been reviewed: Yes (Currently Green 14%) Transition of care needs: no transition of care needs at this time

## 2023-09-10 NOTE — Plan of Care (Signed)
 Received secure chat from radiology concerning patient's KUB showing Diffuse gaseous dilatation of the colon to the level of the sigmoid colon. Findings may represent colonic ileus or distal colonic obstruction. Sigmoid volvulus is not excluded. Consider further evaluation with CT. Case discussed with on-call general surgeon, Dr. Rubin, who recommended CT with oral contrast for further evaluation and reconsulting surgery if there results are concerning. Dr. Tobie evaluated patient at the bedside and she did not endorse any abdominal pain although slightly confused.  I spoke with patient's son, [name] Raddle., over the phone to discuss patient's KUB findings and need for further evaluation with CT scan. Permission obtained from him to proceed with CT scan. The risk and benefits of proceeding with CT scan in the setting of her AKI has been considered and to rule out SBO or sigmoid volvulus, plan to proceed with CT with oral contrast.  Order placed for CT abdomen pelvis with oral contrast only. Dr. Georgina plan to follow up the results of the study.

## 2023-09-11 ENCOUNTER — Inpatient Hospital Stay (HOSPITAL_COMMUNITY)

## 2023-09-11 DIAGNOSIS — N179 Acute kidney failure, unspecified: Secondary | ICD-10-CM | POA: Diagnosis not present

## 2023-09-11 LAB — BASIC METABOLIC PANEL WITH GFR
Anion gap: 11 (ref 5–15)
BUN: 49 mg/dL — ABNORMAL HIGH (ref 8–23)
CO2: 21 mmol/L — ABNORMAL LOW (ref 22–32)
Calcium: 8.5 mg/dL — ABNORMAL LOW (ref 8.9–10.3)
Chloride: 106 mmol/L (ref 98–111)
Creatinine, Ser: 1.45 mg/dL — ABNORMAL HIGH (ref 0.44–1.00)
GFR, Estimated: 34 mL/min — ABNORMAL LOW (ref >60)
Glucose, Bld: 81 mg/dL (ref 70–99)
Potassium: 2.7 mmol/L — CL (ref 3.5–5.1)
Sodium: 138 mmol/L (ref 135–145)

## 2023-09-11 LAB — CBC
HCT: 32.3 % — ABNORMAL LOW (ref 36.0–46.0)
Hemoglobin: 10.7 g/dL — ABNORMAL LOW (ref 12.0–15.0)
MCH: 31.3 pg (ref 26.0–34.0)
MCHC: 33.1 g/dL (ref 30.0–36.0)
MCV: 94.4 fL (ref 80.0–100.0)
Platelets: 419 K/uL — ABNORMAL HIGH (ref 150–400)
RBC: 3.42 MIL/uL — ABNORMAL LOW (ref 3.87–5.11)
RDW: 13.4 % (ref 11.5–15.5)
WBC: 9.5 K/uL (ref 4.0–10.5)
nRBC: 0 % (ref 0.0–0.2)

## 2023-09-11 MED ORDER — POTASSIUM CHLORIDE 10 MEQ/100ML IV SOLN
10.0000 meq | INTRAVENOUS | Status: AC
  Administered 2023-09-11 (×2): 10 meq via INTRAVENOUS
  Filled 2023-09-11 (×2): qty 100

## 2023-09-11 MED ORDER — IOHEXOL 9 MG/ML PO SOLN
500.0000 mL | ORAL | Status: AC
  Administered 2023-09-11 (×2): 500 mL via ORAL

## 2023-09-11 MED ORDER — POTASSIUM CHLORIDE CRYS ER 20 MEQ PO TBCR
40.0000 meq | EXTENDED_RELEASE_TABLET | ORAL | Status: AC
  Administered 2023-09-11 (×2): 40 meq via ORAL
  Filled 2023-09-11 (×2): qty 2

## 2023-09-11 MED ORDER — IOHEXOL 9 MG/ML PO SOLN
ORAL | Status: AC
  Filled 2023-09-11: qty 1000

## 2023-09-11 MED ORDER — CHLORHEXIDINE GLUCONATE CLOTH 2 % EX PADS
6.0000 | MEDICATED_PAD | Freq: Every day | CUTANEOUS | Status: DC
  Administered 2023-09-11 – 2023-09-15 (×5): 6 via TOPICAL

## 2023-09-11 NOTE — Evaluation (Signed)
 Occupational Therapy Evaluation Patient Details Name: HAYVEN CROY MRN: [id number] DOB: [birth year] Today's Date: 09/11/2023   History of Present Illness   BELLE CHARLIE is a 89 y.o. female who presented to the ED for evaluation of generalized weakness. Found to have AKI and Acute metabolic encephalopathy possibly due to AKI.PHMX: anxiety and depression, osteoarthritis, hypertension and hypothyroidism.     Clinical Impressions This 89 yo female admitted with above presents to acute OT with PLOF of being able to be on her own with intermittent A from family for some ADLs and IADLs. Sleeping in a lift chair and getting around with a RW. Currently she is setup-total A for basic ADLs and min-mod A for limited mobility (bed and stand turn to recliner). She will continue to benefit from acute OT with follow up from continued inpatient follow up therapy, <3 hours/day.      If plan is discharge home, recommend the following:   A lot of help with walking and/or transfers;A lot of help with bathing/dressing/bathroom;Assistance with cooking/housework;Assistance with feeding;Help with stairs or ramp for entrance;Assist for transportation;Direct supervision/assist for financial management;Direct supervision/assist for medications management     Functional Status Assessment   Patient has had a recent decline in their functional status and demonstrates the ability to make significant improvements in function in a reasonable and predictable amount of time.     Equipment Recommendations   Other (comment) (TBD next venue)      Precautions/Restrictions   Precautions Precautions: Fall Restrictions Weight Bearing Restrictions Per Provider Order: No     Mobility Bed Mobility Overal bed mobility: Needs Assistance Bed Mobility: Rolling, Sidelying to Sit Rolling: Mod assist Sidelying to sit: Mod assist       General bed mobility comments: A to reach to right with LUE, VCs to bend  up knees to get ready to roll, A to raise trunk, step by step cues to get to sitting EOB (Normally sleeps in a lift chair).    Transfers Overall transfer level: Needs assistance Equipment used: 1 person hand held assist Transfers: Sit to/from Stand Sit to Stand: Min assist           General transfer comment: from bed      Balance Overall balance assessment: Needs assistance Sitting-balance support: Bilateral upper extremity supported Sitting balance-Leahy Scale: Poor Sitting balance - Comments: posterior lean with VCs to lean forward intermittently   Standing balance support: Bilateral upper extremity supported Standing balance-Leahy Scale: Poor                             ADL either performed or assessed with clinical judgement   ADL Overall ADL's : Needs assistance/impaired Eating/Feeding: Set up;Sitting   Grooming: Minimal assistance;Sitting   Upper Body Bathing: Moderate assistance;Sitting   Lower Body Bathing: Total assistance Lower Body Bathing Details (indicate cue type and reason): Min A sit<>stand from bed Upper Body Dressing : Total assistance;Sitting   Lower Body Dressing: Total assistance Lower Body Dressing Details (indicate cue type and reason): Min A sit<>stand from bed Toilet Transfer: Moderate assistance;Stand-pivot Toilet Transfer Details (indicate cue type and reason): from bed>recliner going to her left without AD and therapist standing in front of her Toileting- Clothing Manipulation and Hygiene: Total assistance Toileting - Clothing Manipulation Details (indicate cue type and reason): Min A sit<>stand from bed             Vision Baseline Vision/History: 1 Wears glasses (reading) Ability  to See in Adequate Light: 0 Adequate Patient Visual Report: No change from baseline              Pertinent Vitals/Pain Pain Assessment Pain Assessment: No/denies pain (dtr reports she usually has back pain)     Extremity/Trunk  Assessment Upper Extremity Assessment Upper Extremity Assessment: Generalized weakness;Right hand dominant;RUE deficits/detail;LUE deficits/detail RUE Deficits / Details: Decreased AROM at shoulder for flexion and increased time to raise arm, OA in fingers RUE Coordination: decreased gross motor LUE Deficits / Details: Mild decreased AROM in shoulder with normal time to raise arm           Communication Communication Factors Affecting Communication: Hearing impaired (has hearing aids)   Cognition Arousal: Alert Behavior During Therapy: Flat affect Cognition: Cognition impaired   Orientation impairments: Time, Situation (1930's, did not know the month, I am here because they tell me I was not doing very well) Awareness: Intellectual awareness impaired, Online awareness impaired Memory impairment (select all impairments): Declarative long-term memory Attention impairment (select first level of impairment): Sustained attention Executive functioning impairment (select all impairments): Sequencing, Problem solving OT - Cognition Comments: Told me she sleeps in bed (really sleeps in recliner), told me she does not need an AD to ambulate (uses a RW at home)                 Following commands: Impaired Following commands impaired: Follows one step commands inconsistently, Follows one step commands with increased time     Cueing   Cueing Techniques: Verbal cues;Tactile cues              Home Living Family/patient expects to be discharged to:: Skilled nursing facility Living Arrangements: Alone Available Help at Discharge: Family;Available PRN/intermittently Type of Home: House                       Home Equipment: Lift chair;BSC/3in1   Additional Comments: son lives not  too far from her, dtr lives in [place], cameras to keep an eye on her when neither is there with her.          OT Problem List: Decreased range of motion;Decreased activity  tolerance;Impaired balance (sitting and/or standing);Impaired UE functional use;Decreased cognition   OT Treatment/Interventions: Self-care/ADL training;DME and/or AE instruction;Patient/family education;Balance training      OT Goals(Current goals can be found in the care plan section)   Acute Rehab OT Goals Patient Stated Goal: pt did not state, dtr wants her to get stronger so she can go back home OT Goal Formulation: With patient/family Time For Goal Achievement: 09/25/23 Potential to Achieve Goals: Good   OT Frequency:  Min 2X/week       AM-PAC OT 6 Clicks Daily Activity     Outcome Measure Help from another person eating meals?: A Little Help from another person taking care of personal grooming?: A Little Help from another person toileting, which includes using toliet, bedpan, or urinal?: Total Help from another person bathing (including washing, rinsing, drying)?: A Lot Help from another person to put on and taking off regular upper body clothing?: Total Help from another person to put on and taking off regular lower body clothing?: Total 6 Click Score: 11   End of Session Equipment Utilized During Treatment: Gait belt Nurse Communication: Mobility status (RN in room when I got her from bed to recliner)  Activity Tolerance: Patient tolerated treatment well Patient left: in chair;with call bell/phone within reach;with bed alarm set  OT  Visit Diagnosis: Unsteadiness on feet (R26.81);Other abnormalities of gait and mobility (R26.89);Muscle weakness (generalized) (M62.81);Other symptoms and signs involving cognitive function                Time: 8950-8872 OT Time Calculation (min): 38 min Charges:  OT General Charges $OT Visit: 1 Visit OT Evaluation $OT Eval Moderate Complexity: 1 Mod OT Treatments $Self Care/Home Management : 23-37 mins  Donny BECKER OT Acute Rehabilitation Services Office [phone]    [place] Dorothyann Distel 09/11/2023, 11:49 AM

## 2023-09-11 NOTE — Plan of Care (Signed)
  Problem: Clinical Measurements: Goal: Respiratory complications will improve Outcome: Progressing   Problem: Coping: Goal: Level of anxiety will decrease Outcome: Progressing   Problem: Pain Managment: Goal: General experience of comfort will improve and/or be controlled Outcome: Progressing   Problem: Safety: Goal: Ability to remain free from injury will improve Outcome: Progressing   Problem: Education: Goal: Knowledge of General Education information will improve Description: Including pain rating scale, medication(s)/side effects and non-pharmacologic comfort measures Outcome: Not Progressing   Problem: Health Behavior/Discharge Planning: Goal: Ability to manage health-related needs will improve Outcome: Not Progressing   Problem: Clinical Measurements: Goal: Ability to maintain clinical measurements within normal limits will improve Outcome: Not Progressing   Problem: Activity: Goal: Risk for activity intolerance will decrease Outcome: Not Progressing   Problem: Elimination: Goal: Will not experience complications related to bowel motility Outcome: Not Progressing Goal: Will not experience complications related to urinary retention Outcome: Not Progressing   Problem: Skin Integrity: Goal: Risk for impaired skin integrity will decrease Outcome: Not Progressing

## 2023-09-11 NOTE — Progress Notes (Addendum)
 PROGRESS NOTE  Megan Harmon FMW:[id number] DOB: Jul 22, 1932 DOA: 09/10/2023 PCP: Loreli Kins, MD   LOS: 1 day   Brief Narrative / Interim history: 89 y.o. female with medical history significant for anxiety and depression, osteoarthritis, hypertension and hypothyroidism who presented to the ED for evaluation of generalized weakness. Per son, patient lost her spouse 4 months ago as she has had gradual decline in health with good and bad days. Patient currently lives alone and ambulates with a walker with family close by and monitoring closely via camera. Over the last 2 days, they have noticed patient has had significantly decrease in her mobility. Around 3 AM this morning, they noticed that patient was sitting on something and unable to get up. Son reports that when he checked on patient, she had been sitting on the bottom of her lift chair for hours and had significant difficulty getting up or moving her feet.  Patient has also had recent decrease food and water intake as well as watery stools over the last 2 weeks.   Subjective / 24h Interval events: She is somewhat confused on my evaluation this morning.  She is smiling, pleasant, and has no complaints.  She initially tells me she lives with her husband, but when asked whether he passed away, she realizes that and told me yes - Seems disengaged and does not readily answer my questions regarding her living situation  Assesement and Plan: Principal Problem:   AKI (acute kidney injury) (HCC) Active Problems:   Generalized weakness   Failure to thrive in adult   Dehydration   Abdominal distention   Normocytic anemia   Pressure injury of skin   Principal problem Acute kidney injury-in the setting of her home medication with diuretics, ACE inhibitor, and poor p.o. intake over the last few weeks.  Continue IV fluids, - Repeat renal function this morning pending, phlebotomy appears to be significantly delayed today  Active  problems Generalized weakness, failure to thrive-PT consulted  Acute metabolic encephalopathy-confusion noted this morning, unclear baseline.  Possibly due to #1.  Delirium precautions  Diarrheal illness-CT scan without significant acute findings other than evidence of diarrheal illness.  Obtain C. difficile and GI pathogen panel  Essential hypertension-normotensive this morning off of her blood pressure medications, continue to hold  Hyperlipidemia-continue statin  Hypothyroidism-continue Synthroid , TSH normal  Depression-continue citalopram   Iron deficiency anemia-start supplementation  Mild rhabdomyolysis-CK 429.  On fluids  Acute urinary retention-due to immobility, placed Foley  Scheduled Meds:  acetaminophen   1,000 mg Oral TID   Chlorhexidine  Gluconate Cloth  6 each Topical Daily   citalopram   40 mg Oral Daily   feeding supplement  237 mL Oral BID BM   heparin   5,000 Units Subcutaneous Q8H   levothyroxine   150 mcg Oral Q0600   senna-docusate  1 tablet Oral QHS   simvastatin   20 mg Oral QPM   Continuous Infusions:  sodium chloride  150 mL/hr at 09/11/23 0541   PRN Meds:.ALPRAZolam , bisacodyl , hydrALAZINE , ondansetron  **OR** ondansetron  (ZOFRAN ) IV, oxyCODONE -acetaminophen  **AND** oxyCODONE   Current Outpatient Medications  Medication Instructions   ALPRAZolam  (XANAX ) 0.5 MG tablet Take 0.5-1 tablets (0.25-0.5 mg total) by mouth 3 (three) times daily as needed for anxiety.   amLODipine (NORVASC) 10 mg, Oral, Daily,     benazepril  (LOTENSIN ) 40 mg, Oral, Daily   bimatoprost (LUMIGAN) 0.01 % SOLN 1 drop, Both Eyes, Daily,     calcium carbonate (OS-CAL - DOSED IN MG OF ELEMENTAL CALCIUM) 1250 MG tablet 1 tablet, Oral, Daily,  citalopram  (CELEXA ) 40 mg, Oral, Daily   dorzolamide-timolol (COSOPT) 22.3-6.8 MG/ML ophthalmic solution 1 drop, Both Eyes, 2 times daily,     furosemide  (LASIX ) 20 mg, Oral, Daily PRN   Glucosamine-Chondroit-Vit C-Mn (GLUCOSAMINE 1500 COMPLEX PO)  1 tablet, Oral, Daily,     hydrochlorothiazide  (HYDRODIURIL ) 25 mg, Oral, Every morning   latanoprost (XALATAN) 0.005 % ophthalmic solution 1 drop, Both Eyes, Daily at bedtime,     levothyroxine  (SYNTHROID ) 150 MCG tablet Take 1 tablet (150 mcg total) by mouth every morning on an empty stomach.   meloxicam  (MOBIC ) 15 mg, Oral, Daily   mupirocin  ointment (BACTROBAN ) 2 % 1 Application, Topical, 2 times daily   omeprazole  (PRILOSEC) 40 MG capsule Take 1 capsule (40 mg total) by mouth 2 (two) times daily, with first dose 30 minutes before morning meal.   oxyCODONE -acetaminophen  (PERCOCET) 10-325 MG tablet 1 tablet, Oral, Every 6 hours   silver  sulfADIAZINE  (SILVADENE ) 1 % cream 1 Application, Topical, Daily   simvastatin  (ZOCOR ) 20 mg, Oral, Every evening    Diet Orders (From admission, onward)     Start     Ordered   09/11/23 0007  Diet clear liquid Room service appropriate? Yes; Fluid consistency: Thin  Diet effective now       Question Answer Comment  Room service appropriate? Yes   Fluid consistency: Thin      09/11/23 0007            DVT prophylaxis: heparin  injection 5,000 Units Start: 09/10/23 1415   Lab Results  Component Value Date   PLT 502 (H) 09/10/2023      Code Status: Full Code  Family Communication: No family at bedside  Status is: Inpatient Remains inpatient appropriate because: Severity of illness   Level of care: Med-Surg  Consultants:  None  Objective: Vitals:   09/10/23 1400 09/10/23 1938 09/11/23 0356 09/11/23 0922  BP: (!) 150/64 133/65 (!) 116/52 (!) 118/49  Pulse: 77 98 67 69  Resp: 16 16 16 18   Temp: 98 F (36.7 C) 98 F (36.7 C) 97.6 F (36.4 C) 98.2 F (36.8 C)  TempSrc: Oral Oral Oral Oral  SpO2: 100% 92%  98%  Weight:      Height:        Intake/Output Summary (Last 24 hours) at 09/11/2023 0954 Last data filed at 09/11/2023 0500 Gross per 24 hour  Intake 60 ml  Output 2470 ml  Net -2410 ml   Wt Readings from Last 3  Encounters:  09/10/23 95.3 kg  11/03/22 102.1 kg  04/09/13 102.1 kg    Examination:  Constitutional: NAD Eyes: no scleral icterus ENMT: Mucous membranes are moist.  Neck: normal, supple Respiratory: clear to auscultation bilaterally, no wheezing, no crackles. Cardiovascular: Regular rate and rhythm, no murmurs / rubs / gallops.  Abdomen: non distended, no tenderness. Bowel sounds positive.  Musculoskeletal: no clubbing / cyanosis.   Data Reviewed: I have independently reviewed following labs and imaging studies   CBC Recent Labs  Lab 09/10/23 1041  WBC 12.0*  HGB 11.9*  HCT 36.0  PLT 502*  MCV 93.3  MCH 30.8  MCHC 33.1  RDW 13.5    Recent Labs  Lab 09/10/23 1041 09/10/23 1601  NA 135  --   K 3.5  --   CL 98  --   CO2 22  --   GLUCOSE 114*  --   BUN 77*  --   CREATININE 2.41*  --   CALCIUM 9.1  --  AST 34  --   ALT 22  --   ALKPHOS 96  --   BILITOT 0.6  --   ALBUMIN 2.9*  --   MG  --  1.9  TSH  --  2.854    ------------------------------------------------------------------------------------------------------------------ No results for input(s): CHOL, HDL, LDLCALC, TRIG, CHOLHDL, LDLDIRECT in the last 72 hours.  No results found for: HGBA1C ------------------------------------------------------------------------------------------------------------------ Recent Labs    09/10/23 1601  TSH 2.854    Cardiac Enzymes No results for input(s): CKMB, TROPONINI, MYOGLOBIN in the last 168 hours.  Invalid input(s): CK ------------------------------------------------------------------------------------------------------------------ No results found for: BNP  CBG: Recent Labs  Lab 09/10/23 1042  GLUCAP 101*    No results found for this or any previous visit (from the past 240 hours).   Radiology Studies: CT ABDOMEN PELVIS WO CONTRAST Result Date: 09/11/2023 CLINICAL DATA:  Bowel obstruction EXAM: CT ABDOMEN AND PELVIS WITHOUT  CONTRAST TECHNIQUE: Multidetector CT imaging of the abdomen and pelvis was performed following the standard protocol without IV contrast. RADIATION DOSE REDUCTION: This exam was performed according to the departmental dose-optimization program which includes automated exposure control, adjustment of the mA and/or kV according to patient size and/or use of iterative reconstruction technique. COMPARISON:  None Available. FINDINGS: Lower chest: No acute abnormality.  Small hiatal hernia Hepatobiliary: No focal liver abnormality is seen. Status post cholecystectomy. There is progressive extrahepatic biliary ductal dilation, nonspecific but possibly reflecting post cholecystectomy change. Extrahepatic bile duct measures up to 17 mm in diameter and remains dilated to the level of the ampulla. No definite distal obstructing lesion though this is not optimally assessed on this noncontrast examination. Pancreas: Atrophic but otherwise unremarkable. Spleen: Unremarkable Adrenals/Urinary Tract: Adrenal glands are unremarkable. Kidneys are normal, without renal calculi, focal lesion, or hydronephrosis. Bladder is decompressed with a Foley catheter balloon seen within its lumen. Stomach/Bowel: The colon is diffusely gas and fluid-filled with fluid extending into the rectal vault in keeping with a diarrheal state. Stomach and small bowel are unremarkable. Appendix absent. No free intraperitoneal gas or fluid Vascular/Lymphatic: Aortic atherosclerosis. No enlarged abdominal or pelvic lymph nodes. Reproductive: Status post hysterectomy. No adnexal masses. Other: Small bilateral fat containing inguinal hernias. No abdominopelvic ascites Musculoskeletal: No acute bone abnormality. No lytic or blastic bone lesion. Osseous structures are age appropriate. IMPRESSION: 1. Diffusely gas and fluid-filled colon with fluid extending into the rectal vault in keeping with a diarrheal state. No evidence of obstruction. 2. Progressive  extrahepatic biliary ductal dilation, nonspecific but possibly reflecting post cholecystectomy change. No definite distal obstructing lesion though this is not optimally assessed on this noncontrast examination. Correlation with liver enzymes may be helpful to exclude an obstructive process 3. Aortic atherosclerosis. Aortic Atherosclerosis (ICD10-I70.0). Electronically Signed   By: Dorethia Molt M.D.   On: 09/11/2023 04:18   DG Abd 1 View Result Date: 09/10/2023 CLINICAL DATA:  Abdominal distention EXAM: ABDOMEN - 1 VIEW COMPARISON:  Abdominal x-ray 10/28/2015 FINDINGS: There is diffuse gaseous dilatation of the colon to the level of the sigmoid. The sigmoid colon is dilated measuring up to 9.5 cm. Cholecystectomy clips are present. There are multiple phleboliths in the pelvis. No acute fractures are seen. IMPRESSION: Diffuse gaseous dilatation of the colon to the level of the sigmoid colon. Findings may represent colonic ileus or distal colonic obstruction. Sigmoid volvulus is not excluded. Consider further evaluation with CT. Electronically Signed   By: Greig Pique M.D.   On: 09/10/2023 18:30   US  RENAL Result Date: 09/10/2023 CLINICAL DATA:  Acute kidney injury EXAM: RENAL / URINARY TRACT ULTRASOUND COMPLETE COMPARISON:  CT abdomen and pelvis 11/20/2010 FINDINGS: Right Kidney: Renal measurements: 10.6 x 4.1 x 4.6 cm = volume: 104 mL. Echogenicity within normal limits. No hydronephrosis. There is a 1.0 x 0.9 x 1.1 cm cyst in the superior pole of the right kidney. Left Kidney: Renal measurements: 12.1 x 6.0 x 4.9 cm = volume: 187 mL. Echogenicity within normal limits. No mass or hydronephrosis visualized. Bladder: Appears normal for degree of bladder distention. Other: None. IMPRESSION: 1. No hydronephrosis. 2. 1.1 cm cyst in the superior pole of the right kidney. Electronically Signed   By: Greig Pique M.D.   On: 09/10/2023 18:15   DG Chest Port 1 View Result Date: 09/10/2023 CLINICAL DATA:   Weakness. EXAM: PORTABLE CHEST 1 VIEW COMPARISON:  January 22, 2020. FINDINGS: The heart size and mediastinal contours are within normal limits. Both lungs are clear. Elevated left hemidiaphragm is noted. Degenerative changes are seen involving both glenohumeral joints. IMPRESSION: No active disease. Electronically Signed   By: Lynwood Landy Raddle M.D.   On: 09/10/2023 11:03     Nilda Fendt, MD, PhD [facility]  Between 7 am - 7 pm I am available, please contact me via Amion (for emergencies) or Securechat (non urgent messages)  Between 7 pm - 7 am I am not available, please contact night coverage MD/APP via Amion

## 2023-09-11 NOTE — Progress Notes (Signed)
 Foley placed per MD Georgina. 700 output. Pt drinking oral contrast now for CT

## 2023-09-11 NOTE — Evaluation (Signed)
 Physical Therapy Evaluation Patient Details Name: Megan Harmon MRN: [id number] DOB: 1932-08-10 Today's Date: 09/11/2023  History of Present Illness  Megan Harmon is a 89 y.o. female who presented to the ED 8/29 for evaluation of generalized weakness. Found to have AKI and Acute metabolic encephalopathy possibly due to AKI.PHMX: anxiety and depression, osteoarthritis, hypertension and hypothyroidism.  Clinical Impression  Pt admitted with above diagnosis. Previously independent at home with modifications, using RW and BSC, Lift chair. Has gotten progressively weak over the past 2 weeks. Requires min assist for transfer to stand, step to [facility], and ambulate short distance in room. Min assist to balance while performing Peri-care after unsuccessful attempt of BM on BSC with feeling over urgency in standing. Patient will benefit from continued inpatient follow up therapy, <3 hours/day. Daughter present and very supportive.  Pt currently with functional limitations due to the deficits listed below (see PT Problem List). Pt will benefit from acute skilled PT to increase their independence and safety with mobility to allow discharge.           If plan is discharge home, recommend the following: A little help with walking and/or transfers;A little help with bathing/dressing/bathroom;Assistance with cooking/housework;Assist for transportation;Help with stairs or ramp for entrance   Can travel by private vehicle   Yes    Equipment Recommendations None recommended by PT  Recommendations for Other Services       Functional Status Assessment Patient has had a recent decline in their functional status and demonstrates the ability to make significant improvements in function in a reasonable and predictable amount of time.     Precautions / Restrictions Precautions Precautions: Fall Recall of Precautions/Restrictions: Impaired Restrictions Weight Bearing Restrictions Per Provider Order: No       Mobility  Bed Mobility               General bed mobility comments: in recliner    Transfers Overall transfer level: Needs assistance Equipment used: Rolling walker (2 wheels) Transfers: Sit to/from Stand, Bed to chair/wheelchair/BSC Sit to Stand: Min assist   Step pivot transfers: Min assist       General transfer comment: Min assist for boost to stand from recliner x2 and from [facility] x1. Cues for hand placement. leans posteriorly, RW to steady, cues for anterior weight shift.    Ambulation/Gait Ambulation/Gait assistance: Min assist Gait Distance (Feet): 25 Feet Assistive device: Rolling walker (2 wheels) Gait Pattern/deviations: Step-through pattern, Decreased stride length, Drifts right/left, Shuffle, Trunk flexed Gait velocity: dec Gait velocity interpretation: <1.31 ft/sec, indicative of household ambulator   General Gait Details: Cues for upright posture, with RW, close proximity to device, and larger step length for improved foot clearance. No overt buckling but min assist for RW control and balance. No overt buckling. Fatigues easily.  Stairs            Wheelchair Mobility     Tilt Bed    Modified Rankin (Stroke Patients Only)       Balance Overall balance assessment: Needs assistance Sitting-balance support: No upper extremity supported Sitting balance-Leahy Scale: Fair     Standing balance support: Bilateral upper extremity supported Standing balance-Leahy Scale: Poor                               Pertinent Vitals/Pain Pain Assessment Pain Assessment: No/denies pain    Home Living Family/patient expects to be discharged to:: Skilled nursing facility Living Arrangements:  Alone Available Help at Discharge: Family;Available PRN/intermittently Type of Home: House           Home Equipment: Lift chair;BSC/3in1;Rolling Environmental consultant (2 wheels) Additional Comments: son lives not  too far from her, dtr lives in [place], cameras to  keep an eye on her when neither is there with her.    Prior Function Prior Level of Function : Independent/Modified Independent             Mobility Comments: mobililzing with RW household distances until decline this week ADLs Comments: Using [facility]     Extremity/Trunk Assessment   Upper Extremity Assessment Upper Extremity Assessment: Defer to OT evaluation RUE Deficits / Details: Decreased AROM at shoulder for flexion and increased time to raise arm, OA in fingers RUE Coordination: decreased gross motor LUE Deficits / Details: Mild decreased AROM in shoulder with normal time to raise arm    Lower Extremity Assessment Lower Extremity Assessment: Generalized weakness       Communication   Communication Communication: Impaired Factors Affecting Communication:  (has hearing aids)    Cognition Arousal: Alert Behavior During Therapy: Flat affect   PT - Cognitive impairments: Safety/Judgement, Initiation                         Following commands: Impaired Following commands impaired: Follows one step commands inconsistently, Follows one step commands with increased time     Cueing Cueing Techniques: Verbal cues, Tactile cues, Gestural cues     General Comments General comments (skin integrity, edema, etc.): Bowel urgency without BM after transferring to [facility]. Stood with min assist while she performed peri-care.    Exercises     Assessment/Plan    PT Assessment Patient needs continued PT services  PT Problem List Decreased strength;Decreased activity tolerance;Decreased range of motion;Decreased balance;Decreased mobility;Decreased cognition;Decreased knowledge of use of DME;Decreased safety awareness;Decreased knowledge of precautions       PT Treatment Interventions DME instruction;Gait training;Functional mobility training;Therapeutic activities;Therapeutic exercise;Balance training;Neuromuscular re-education;Patient/family education;Cognitive  remediation    PT Goals (Current goals can be found in the Care Plan section)  Acute Rehab PT Goals Patient Stated Goal: get well PT Goal Formulation: With patient/family Time For Goal Achievement: 09/24/23 Potential to Achieve Goals: Good    Frequency Min 2X/week     Co-evaluation               AM-PAC PT 6 Clicks Mobility  Outcome Measure Help needed turning from your back to your side while in a flat bed without using bedrails?: A Little Help needed moving from lying on your back to sitting on the side of a flat bed without using bedrails?: A Little Help needed moving to and from a bed to a chair (including a wheelchair)?: A Little Help needed standing up from a chair using your arms (e.g., wheelchair or bedside chair)?: A Little Help needed to walk in hospital room?: None Help needed climbing 3-5 steps with a railing? : A Lot 6 Click Score: 18    End of Session Equipment Utilized During Treatment: Gait belt Activity Tolerance: Patient tolerated treatment well Patient left: in chair;with call bell/phone within reach;with chair alarm set;with family/visitor present Nurse Communication: Mobility status PT Visit Diagnosis: Unsteadiness on feet (R26.81);Other abnormalities of gait and mobility (R26.89);Muscle weakness (generalized) (M62.81);Difficulty in walking, not elsewhere classified (R26.2)    Time: 8770-8745 PT Time Calculation (min) (ACUTE ONLY): 25 min   Charges:   PT Evaluation $PT Eval Low Complexity: 1  Low PT Treatments $Therapeutic Activity: 8-22 mins PT General Charges $$ ACUTE PT VISIT: 1 Visit         Leontine Roads, PT, DPT [facility]  Rehabilitation Services Physical Therapist Office: [phone] Website: [email or web address]   Leontine GORMAN Roads 09/11/2023, 2:22 PM

## 2023-09-12 DIAGNOSIS — H409 Unspecified glaucoma: Secondary | ICD-10-CM | POA: Diagnosis not present

## 2023-09-12 DIAGNOSIS — F330 Major depressive disorder, recurrent, mild: Secondary | ICD-10-CM | POA: Diagnosis not present

## 2023-09-12 DIAGNOSIS — E039 Hypothyroidism, unspecified: Secondary | ICD-10-CM | POA: Diagnosis not present

## 2023-09-12 DIAGNOSIS — N179 Acute kidney failure, unspecified: Secondary | ICD-10-CM | POA: Diagnosis not present

## 2023-09-12 DIAGNOSIS — E669 Obesity, unspecified: Secondary | ICD-10-CM | POA: Diagnosis not present

## 2023-09-12 LAB — C DIFFICILE QUICK SCREEN W PCR REFLEX
C Diff antigen: NEGATIVE
C Diff interpretation: NOT DETECTED
C Diff toxin: NEGATIVE

## 2023-09-12 LAB — BASIC METABOLIC PANEL WITH GFR
Anion gap: 12 (ref 5–15)
BUN: 42 mg/dL — ABNORMAL HIGH (ref 8–23)
CO2: 19 mmol/L — ABNORMAL LOW (ref 22–32)
Calcium: 8.5 mg/dL — ABNORMAL LOW (ref 8.9–10.3)
Chloride: 108 mmol/L (ref 98–111)
Creatinine, Ser: 1.28 mg/dL — ABNORMAL HIGH (ref 0.44–1.00)
GFR, Estimated: 40 mL/min — ABNORMAL LOW (ref >60)
Glucose, Bld: 84 mg/dL (ref 70–99)
Potassium: 2.8 mmol/L — ABNORMAL LOW (ref 3.5–5.1)
Sodium: 139 mmol/L (ref 135–145)

## 2023-09-12 LAB — GASTROINTESTINAL PANEL BY PCR, STOOL (REPLACES STOOL CULTURE)

## 2023-09-12 MED ORDER — POTASSIUM CHLORIDE CRYS ER 20 MEQ PO TBCR
40.0000 meq | EXTENDED_RELEASE_TABLET | ORAL | Status: AC
  Administered 2023-09-12 (×2): 40 meq via ORAL
  Filled 2023-09-12 (×2): qty 2

## 2023-09-12 MED ORDER — POTASSIUM CHLORIDE 10 MEQ/100ML IV SOLN
10.0000 meq | INTRAVENOUS | Status: AC
  Administered 2023-09-12 (×2): 10 meq via INTRAVENOUS
  Filled 2023-09-12 (×2): qty 100

## 2023-09-12 NOTE — Progress Notes (Signed)
   09/12/23 1239  Mobility  Activity Ambulated with assistance  Level of Assistance Minimal assist, patient does 75% or more  Assistive Device Front wheel walker  Distance Ambulated (ft) 3 ft  Activity Response Tolerated fair  Mobility Referral Yes  Mobility visit 1 Mobility  Mobility Specialist Start Time (ACUTE ONLY) 1239  Mobility Specialist Stop Time (ACUTE ONLY) 1304  Mobility Specialist Time Calculation (min) (ACUTE ONLY) 25 min   Mobility Specialist: Progress Note  Pt agreeable to mobility session - received in bed. C/o BLE weakness. Returned to chair with all needs met - call bell within reach. Son present.   Virgle Boards, BS Mobility Specialist Please contact via SecureChat or  Rehab office at [phone].

## 2023-09-12 NOTE — Plan of Care (Signed)

## 2023-09-12 NOTE — Plan of Care (Signed)
  Problem: Education: Goal: Knowledge of General Education information will improve Description: Including pain rating scale, medication(s)/side effects and non-pharmacologic comfort measures Outcome: Progressing   Problem: Health Behavior/Discharge Planning: Goal: Ability to manage health-related needs will improve Outcome: Progressing   Problem: Clinical Measurements: Goal: Ability to maintain clinical measurements within normal limits will improve Outcome: Progressing Goal: Will remain free from infection Outcome: Progressing Goal: Diagnostic test results will improve Outcome: Progressing Goal: Respiratory complications will improve Outcome: Progressing Goal: Cardiovascular complication will be avoided Outcome: Progressing   Problem: Clinical Measurements: Goal: Will remain free from infection Outcome: Progressing   Problem: Safety: Goal: Ability to remain free from injury will improve Outcome: Progressing   Problem: Skin Integrity: Goal: Risk for impaired skin integrity will decrease Outcome: Progressing   Problem: Pain Managment: Goal: General experience of comfort will improve and/or be controlled Outcome: Progressing   Problem: Elimination: Goal: Will not experience complications related to bowel motility Outcome: Progressing Goal: Will not experience complications related to urinary retention Outcome: Progressing

## 2023-09-12 NOTE — TOC Initial Note (Signed)
 Transition of Care [facility]) - Initial/Assessment Note    Patient Details  Name: Megan Harmon MRN: [id number] Date of Birth: [birth year]  [facility] of [facility] CM/SW Contact:    Hartley KATHEE Robertson, LCSWA Phone Number: 09/12/2023, 1:57 PM  Clinical Narrative:                  CSW spoke with pt's son [name] concerning SNF recs, pt's son agreeable, hopeful for a placement near [facility] as that's where they live. CSW explained insurance auth process and Medicare.gov ratings site. Will continue to follow.        Patient Goals and CMS Choice            Expected Discharge Plan and Services                                              Prior Living Arrangements/Services                       Activities of Daily Living   ADL Screening (condition at time of admission) Independently performs ADLs?: No Does the patient have a NEW difficulty with bathing/dressing/toileting/self-feeding that is expected to last >3 days?: Yes (Initiates electronic notice to provider for possible OT consult) Does the patient have a NEW difficulty with getting in/out of bed, walking, or climbing stairs that is expected to last >3 days?: Yes (Initiates electronic notice to provider for possible PT consult) Does the patient have a NEW difficulty with communication that is expected to last >3 days?: Yes (Initiates electronic notice to provider for possible SLP consult) Is the patient deaf or have difficulty hearing?: Yes Does the patient have difficulty seeing, even when wearing glasses/contacts?: No Does the patient have difficulty concentrating, remembering, or making decisions?: Yes  Permission Sought/Granted                  Emotional Assessment              Admission diagnosis:  Dehydration [E86.0] Failure to thrive in adult [R62.7] Generalized weakness [R53.1] AKI (acute kidney injury) (HCC) [N17.9] Moderate protein-calorie malnutrition (HCC) [E44.0] Callous  ulcer, unspecified ulcer stage (HCC) [L98.499] Patient Active Problem List   Diagnosis Date Noted   Essential hypertension 09/10/2023   Gastroesophageal reflux disease 09/10/2023   Glaucoma 09/10/2023   Hypothyroidism 09/10/2023   Mixed hyperlipidemia 09/10/2023   Obesity 09/10/2023   Osteoarthritis 09/10/2023   Senile osteopenia 09/10/2023   AKI (acute kidney injury) (HCC) 09/10/2023   Generalized weakness 09/10/2023   Failure to thrive in adult 09/10/2023   Dehydration 09/10/2023   Abdominal distention 09/10/2023   Normocytic anemia 09/10/2023   Pressure injury of skin 09/10/2023   PCP:  Loreli Kins, MD Pharmacy:   [phone] - [address], [place] - [address] [place] [address] Phone: [phone] Fax: [phone]  [place] [facility] [address] [place] [address] Phone: [phone] Fax: [phone]  [facility] [address] [place] [address] Phone: [phone] Fax: [phone]     Social Drivers of Health (SDOH) Social History: SDOH Screenings   Food Insecurity: No Food Insecurity (09/10/2023)  Housing: Low Risk  (09/10/2023)  Transportation Needs: No Transportation Needs (09/10/2023)  Utilities: Not At Risk (09/10/2023)  Social Connections: Patient Unable To Answer (09/10/2023)  Tobacco Use: Low Risk  (09/10/2023)   SDOH Interventions:     Readmission Risk Interventions     No data to display

## 2023-09-12 NOTE — NC FL2 (Signed)
 [place]  MEDICAID FL2 LEVEL OF CARE FORM     IDENTIFICATION  Patient Name: Megan Harmon Birthdate: [birth year] Sex: female Admission Date (Current Location): 09/10/2023  [facility] and [place] Number:  Producer, television/film/video and Address:  [facility] [place], [address], [place], [place] [address]      Provider Number: [id number]  Attending Physician Name and Address:  Trixie Nilda HERO, MD  Relative Name and Phone Number:  [place], [place] [facility])  [phone]    Current Level of Care: Hospital Recommended Level of Care: Skilled Nursing Facility Prior Approval Number:    Date Approved/Denied:   PASRR Number: [id number]  Discharge Plan: SNF    Current Diagnoses: Patient Active Problem List   Diagnosis Date Noted   Essential hypertension 09/10/2023   Gastroesophageal reflux disease 09/10/2023   Glaucoma 09/10/2023   Hypothyroidism 09/10/2023   Mixed hyperlipidemia 09/10/2023   Obesity 09/10/2023   Osteoarthritis 09/10/2023   Senile osteopenia 09/10/2023   AKI (acute kidney injury) (HCC) 09/10/2023   Generalized weakness 09/10/2023   Failure to thrive in adult 09/10/2023   Dehydration 09/10/2023   Abdominal distention 09/10/2023   Normocytic anemia 09/10/2023   Pressure injury of skin 09/10/2023    Orientation RESPIRATION BLADDER Height & Weight     Self, Time  Normal Incontinent, External catheter Weight: 210 lb (95.3 kg) Height:  5' 6 (167.6 cm)  BEHAVIORAL SYMPTOMS/MOOD NEUROLOGICAL BOWEL NUTRITION STATUS      Incontinent Diet (see dc summary)  AMBULATORY STATUS COMMUNICATION OF NEEDS Skin   Extensive Assist Verbally Other (Comment) (Pressure injury buttoks left stage 2)                       Personal Care Assistance Level of Assistance  Bathing, Feeding, Dressing Bathing Assistance: Limited assistance Feeding assistance: Limited assistance Dressing Assistance: Limited assistance     Functional Limitations Info  Sight,  Hearing, Speech Sight Info: Impaired Hearing Info: Impaired Speech Info: Adequate    SPECIAL CARE FACTORS FREQUENCY  PT (By licensed PT), OT (By licensed OT)     PT Frequency: 5x week OT Frequency: 5x week            Contractures Contractures Info: Not present    Additional Factors Info  Code Status, Allergies, Isolation Precautions Code Status Info: DNR Allergies Info: NKA     Isolation Precautions Info: Enteric Precautions     Current Medications (09/12/2023):  This is the current hospital active medication list Current Facility-Administered Medications  Medication Dose Route Frequency Provider Last Rate Last Admin   acetaminophen  (TYLENOL ) tablet 1,000 mg  1,000 mg Oral TID Amponsah, Prosper M, MD   1,000 mg at 09/12/23 9141   ALPRAZolam  (XANAX ) tablet 0.25-0.5 mg  0.25-0.5 mg Oral TID PRN Amponsah, Prosper M, MD       bisacodyl  (DULCOLAX) EC tablet 5 mg  5 mg Oral Daily PRN Amponsah, Prosper M, MD       Chlorhexidine  Gluconate Cloth 2 % PADS 6 each  6 each Topical Daily Georgina Basket, MD   6 each at 09/12/23 0859   citalopram  (CELEXA ) tablet 40 mg  40 mg Oral Daily Amponsah, Prosper M, MD   40 mg at 09/12/23 0858   feeding supplement (ENSURE PLUS HIGH PROTEIN) liquid 237 mL  237 mL Oral BID BM Amponsah, Prosper M, MD   237 mL at 09/12/23 0859   heparin  injection 5,000 Units  5,000 Units Subcutaneous Q8H Lou Mill M,  MD   5,000 Units at 09/12/23 1408   hydrALAZINE  (APRESOLINE ) injection 10 mg  10 mg Intravenous Q8H PRN Amponsah, Prosper M, MD       levothyroxine  (SYNTHROID ) tablet 150 mcg  150 mcg Oral Q0600 Amponsah, Prosper M, MD   150 mcg at 09/12/23 9383   ondansetron  (ZOFRAN ) tablet 4 mg  4 mg Oral Q6H PRN Amponsah, Prosper M, MD       Or   ondansetron  (ZOFRAN ) injection 4 mg  4 mg Intravenous Q6H PRN Amponsah, Prosper M, MD       oxyCODONE -acetaminophen  (PERCOCET/ROXICET) 5-325 MG per tablet 1 tablet  1 tablet Oral Q6H PRN Lou Claretta HERO, MD       And    oxyCODONE  (Oxy IR/ROXICODONE ) immediate release tablet 5 mg  5 mg Oral Q6H PRN Amponsah, Prosper M, MD       senna-docusate (Senokot-S) tablet 1 tablet  1 tablet Oral QHS Lou Claretta HERO, MD       simvastatin  (ZOCOR ) tablet 20 mg  20 mg Oral QPM Amponsah, Prosper M, MD   20 mg at 09/11/23 1720     Discharge Medications: Please see discharge summary for a list of discharge medications.  Relevant Imaging Results:  Relevant Lab Results:   Additional Information SSN [id number]  Hartley KATHEE Robertson, LCSWA

## 2023-09-12 NOTE — Progress Notes (Signed)
 PROGRESS NOTE  Megan Harmon FMW:[id number] DOB: 1932-09-24 DOA: 09/10/2023 PCP: Loreli Kins, MD   LOS: 2 days   Brief Narrative / Interim history: 89 y.o. female with medical history significant for anxiety and depression, osteoarthritis, hypertension and hypothyroidism who presented to the ED for evaluation of generalized weakness. Per son, patient lost her spouse 4 months ago as she has had gradual decline in health with good and bad days. Patient currently lives alone and ambulates with a walker with family close by and monitoring closely via camera. Over the last 2 days, they have noticed patient has had significantly decrease in her mobility. Around 3 AM this morning, they noticed that patient was sitting on something and unable to get up. Son reports that when he checked on patient, she had been sitting on the bottom of her lift chair for hours and had significant difficulty getting up or moving her feet.  Patient has also had recent decrease food and water intake as well as watery stools over the last 2 weeks.   Subjective / 24h Interval events: She is telling me that she is feeling well.  No abdominal discomfort, no nausea or vomiting.  Is not eating well due to not feeling hungry  Assesement and Plan: Principal Problem:   AKI (acute kidney injury) (HCC) Active Problems:   Generalized weakness   Failure to thrive in adult   Dehydration   Abdominal distention   Normocytic anemia   Pressure injury of skin   Principal problem Acute kidney injury-in the setting of her home medication with diuretics, ACE inhibitor, and poor p.o. intake over the last few weeks.  Has been placed on IV fluids, creatinine much better with 1.2 today  Active problems Generalized weakness, failure to thrive-PT consulted, needs SNF  Acute metabolic encephalopathy-has mild intermittent confusion, possibly due to #1  Diarrheal illness-CT scan without significant acute findings other than evidence of  diarrheal illness.  Given GI pathogen panel ordered, stool not sent yet  Essential hypertension-remains normotensive  Hypokalemia-possibly due to GI losses, continue to replenish potassium aggressively.  Magnesium  normal  Hyperlipidemia-continue statin  Hypothyroidism-continue Synthroid , TSH normal  Depression-continue citalopram   Iron deficiency anemia-start supplementation  Mild rhabdomyolysis-CK 429.  On fluids  Acute urinary retention-due to immobility, placed Foley  Scheduled Meds:  acetaminophen   1,000 mg Oral TID   Chlorhexidine  Gluconate Cloth  6 each Topical Daily   citalopram   40 mg Oral Daily   feeding supplement  237 mL Oral BID BM   heparin   5,000 Units Subcutaneous Q8H   levothyroxine   150 mcg Oral Q0600   potassium chloride   40 mEq Oral Q3H   senna-docusate  1 tablet Oral QHS   simvastatin   20 mg Oral QPM   Continuous Infusions:  potassium chloride      PRN Meds:.ALPRAZolam , bisacodyl , hydrALAZINE , ondansetron  **OR** ondansetron  (ZOFRAN ) IV, oxyCODONE -acetaminophen  **AND** oxyCODONE   Current Outpatient Medications  Medication Instructions   ALPRAZolam  (XANAX ) 0.5 MG tablet Take 0.5-1 tablets (0.25-0.5 mg total) by mouth 3 (three) times daily as needed for anxiety.   benazepril  (LOTENSIN ) 40 mg, Oral, Daily   bimatoprost (LUMIGAN) 0.01 % SOLN 1 drop, Daily   calcium carbonate (OS-CAL - DOSED IN MG OF ELEMENTAL CALCIUM) 1250 MG tablet 1 tablet, Oral, Daily,     citalopram  (CELEXA ) 40 mg, Oral, Daily   Docusate Calcium (STOOL SOFTENER PO) 1 capsule, 2 times daily PRN   dorzolamide-timolol (COSOPT) 22.3-6.8 MG/ML ophthalmic solution 1 drop, 2 times daily   furosemide  (LASIX ) 20  mg, Oral, Daily PRN   hydrochlorothiazide  (HYDRODIURIL ) 25 mg, Oral, Every morning   latanoprost (XALATAN) 0.005 % ophthalmic solution 1 drop, Both Eyes, Daily at bedtime,     levothyroxine  (SYNTHROID ) 150 MCG tablet Take 1 tablet (150 mcg total) by mouth every morning on an empty  stomach.   meloxicam  (MOBIC ) 15 mg, Oral, Daily   Multiple Vitamins-Minerals (MULTIVITAL PO) 1 tablet, Daily   oxyCODONE -acetaminophen  (PERCOCET) 10-325 MG tablet 1 tablet, Oral, Every 6 hours   simvastatin  (ZOCOR ) 20 mg, Oral, Every evening   TART CHERRY PO 1 tablet, Daily    Diet Orders (From admission, onward)     Start     Ordered   09/11/23 0007  Diet clear liquid Room service appropriate? Yes; Fluid consistency: Thin  Diet effective now       Question Answer Comment  Room service appropriate? Yes   Fluid consistency: Thin      09/11/23 0007            DVT prophylaxis: heparin  injection 5,000 Units Start: 09/10/23 1415   Lab Results  Component Value Date   PLT 419 (H) 09/11/2023      Code Status: Limited: Do not attempt resuscitation (DNR) -DNR-LIMITED -Do Not Intubate/DNI   Family Communication: No family at bedside  Status is: Inpatient Remains inpatient appropriate because: Severity of illness   Level of care: Med-Surg  Consultants:  None  Objective: Vitals:   09/11/23 1541 09/11/23 1958 09/12/23 0614 09/12/23 0723  BP: (!) 137/57 138/63 (!) 119/54 (!) 126/56  Pulse: 64 67 64 66  Resp: 18 17 17 18   Temp: (!) 97.5 F (36.4 C) 97.6 F (36.4 C) 98.5 F (36.9 C) 97.8 F (36.6 C)  TempSrc: Oral Oral Oral Oral  SpO2: 97% 97% 95%   Weight:      Height:        Intake/Output Summary (Last 24 hours) at 09/12/2023 1015 Last data filed at 09/12/2023 0640 Gross per 24 hour  Intake 720 ml  Output 1800 ml  Net -1080 ml   Wt Readings from Last 3 Encounters:  09/10/23 95.3 kg  11/03/22 102.1 kg  04/09/13 102.1 kg    Examination:  Constitutional: NAD Eyes: lids and conjunctivae normal, no scleral icterus ENMT: mmm Neck: normal, supple Respiratory: clear to auscultation bilaterally, no wheezing, no crackles. Normal respiratory effort.  Cardiovascular: Regular rate and rhythm, no murmurs / rubs / gallops. No LE edema. Abdomen: soft, no distention,  no tenderness. Bowel sounds positive.   Data Reviewed: I have independently reviewed following labs and imaging studies   CBC Recent Labs  Lab 09/10/23 1041 09/11/23 1210  WBC 12.0* 9.5  HGB 11.9* 10.7*  HCT 36.0 32.3*  PLT 502* 419*  MCV 93.3 94.4  MCH 30.8 31.3  MCHC 33.1 33.1  RDW 13.5 13.4    Recent Labs  Lab 09/10/23 1041 09/10/23 1601 09/11/23 1210 09/12/23 0835  NA 135  --  138 139  K 3.5  --  2.7* 2.8*  CL 98  --  106 108  CO2 22  --  21* 19*  GLUCOSE 114*  --  81 84  BUN 77*  --  49* 42*  CREATININE 2.41*  --  1.45* 1.28*  CALCIUM 9.1  --  8.5* 8.5*  AST 34  --   --   --   ALT 22  --   --   --   ALKPHOS 96  --   --   --  BILITOT 0.6  --   --   --   ALBUMIN 2.9*  --   --   --   MG  --  1.9  --   --   TSH  --  2.854  --   --     ------------------------------------------------------------------------------------------------------------------ No results for input(s): CHOL, HDL, LDLCALC, TRIG, CHOLHDL, LDLDIRECT in the last 72 hours.  No results found for: HGBA1C ------------------------------------------------------------------------------------------------------------------ Recent Labs    09/10/23 1601  TSH 2.854    Cardiac Enzymes No results for input(s): CKMB, TROPONINI, MYOGLOBIN in the last 168 hours.  Invalid input(s): CK ------------------------------------------------------------------------------------------------------------------ No results found for: BNP  CBG: Recent Labs  Lab 09/10/23 1042  GLUCAP 101*    No results found for this or any previous visit (from the past 240 hours).   Radiology Studies: No results found.    Nilda Fendt, MD, PhD [facility]  Between 7 am - 7 pm I am available, please contact me via Amion (for emergencies) or Securechat (non urgent messages)  Between 7 pm - 7 am I am not available, please contact night coverage MD/APP via Amion

## 2023-09-13 DIAGNOSIS — N179 Acute kidney failure, unspecified: Secondary | ICD-10-CM | POA: Diagnosis not present

## 2023-09-13 LAB — CBC
HCT: 32.7 % — ABNORMAL LOW (ref 36.0–46.0)
Hemoglobin: 10.8 g/dL — ABNORMAL LOW (ref 12.0–15.0)
MCH: 31.3 pg (ref 26.0–34.0)
MCHC: 33 g/dL (ref 30.0–36.0)
MCV: 94.8 fL (ref 80.0–100.0)
Platelets: 428 K/uL — ABNORMAL HIGH (ref 150–400)
RBC: 3.45 MIL/uL — ABNORMAL LOW (ref 3.87–5.11)
RDW: 13.6 % (ref 11.5–15.5)
WBC: 11.6 K/uL — ABNORMAL HIGH (ref 4.0–10.5)
nRBC: 0 % (ref 0.0–0.2)

## 2023-09-13 LAB — BASIC METABOLIC PANEL WITH GFR
Anion gap: 15 (ref 5–15)
BUN: 31 mg/dL — ABNORMAL HIGH (ref 8–23)
CO2: 21 mmol/L — ABNORMAL LOW (ref 22–32)
Calcium: 8.9 mg/dL (ref 8.9–10.3)
Chloride: 111 mmol/L (ref 98–111)
Creatinine, Ser: 1.18 mg/dL — ABNORMAL HIGH (ref 0.44–1.00)
GFR, Estimated: 44 mL/min — ABNORMAL LOW (ref >60)
Glucose, Bld: 121 mg/dL — ABNORMAL HIGH (ref 70–99)
Potassium: 3 mmol/L — ABNORMAL LOW (ref 3.5–5.1)
Sodium: 147 mmol/L — ABNORMAL HIGH (ref 135–145)

## 2023-09-13 LAB — MAGNESIUM: Magnesium: 2 mg/dL (ref 1.7–2.4)

## 2023-09-13 MED ORDER — DEXTROSE 5 % IV SOLN: INTRAVENOUS | Status: AC

## 2023-09-13 MED ORDER — LOPERAMIDE HCL 2 MG PO CAPS
2.0000 mg | ORAL_CAPSULE | ORAL | Status: DC | PRN
  Administered 2023-09-14 (×2): 2 mg via ORAL
  Filled 2023-09-13 (×2): qty 1

## 2023-09-13 MED ORDER — POTASSIUM CHLORIDE CRYS ER 20 MEQ PO TBCR
40.0000 meq | EXTENDED_RELEASE_TABLET | ORAL | Status: AC
  Administered 2023-09-13 (×2): 40 meq via ORAL
  Filled 2023-09-13 (×2): qty 2

## 2023-09-13 NOTE — Plan of Care (Signed)

## 2023-09-13 NOTE — Progress Notes (Signed)
 PROGRESS NOTE  Megan Harmon FMW:[id number] DOB: 1932/10/23 DOA: 09/10/2023 PCP: Loreli Kins, MD   LOS: 3 days   Brief Narrative / Interim history: 89 y.o. female with medical history significant for anxiety and depression, osteoarthritis, hypertension and hypothyroidism who presented to the ED for evaluation of generalized weakness. Per son, patient lost her spouse 4 months ago as she has had gradual decline in health with good and bad days. Patient currently lives alone and ambulates with a walker with family close by and monitoring closely via camera. Over the last 2 days, they have noticed patient has had significantly decrease in her mobility. Around 3 AM this morning, they noticed that patient was sitting on something and unable to get up. Son reports that when he checked on patient, she had been sitting on the bottom of her lift chair for hours and had significant difficulty getting up or moving her feet.  Patient has also had recent decrease food and water intake as well as watery stools over the last 2 weeks.   Subjective / 24h Interval events: She has no complaints today, she says I am feeling fine.  She knows that she is in the hospital, but does not recall the year  Assesement and Plan: Principal Problem:   AKI (acute kidney injury) (HCC) Active Problems:   Generalized weakness   Failure to thrive in adult   Dehydration   Abdominal distention   Normocytic anemia   Pressure injury of skin   Principal problem Acute kidney injury-in the setting of her home medication with diuretics, ACE inhibitor, and poor p.o. intake over the last few weeks.  Creatinine almost back to normal today with IV fluids  Active problems Generalized weakness, failure to thrive-PT consulted, needs SNF  Hypernatremia-D5W today, suspect poor p.o. intake  Acute metabolic encephalopathy-has mild intermittent confusion, possibly due to #1 versus mild undiagnosed dementia at home that gotten worse  in the hospital.  She also lost her spouse about 4 months ago, has probably underlying worsening depression  Diarrheal illness-CT scan without significant acute findings other than evidence of diarrheal illness.  ED for GI pathogen panel both negative.  Suspect nonspecific, start Imodium   Essential hypertension-remains normotensive  Hypokalemia-potassium gradually improving, continue to replete  Hyperlipidemia-continue statin  Hypothyroidism-continue Synthroid , TSH normal  Depression-continue citalopram   Iron deficiency anemia-start supplementation  Mild rhabdomyolysis-CK 429.  On fluids  Acute urinary retention-due to immobility, placed Foley  Scheduled Meds:  acetaminophen   1,000 mg Oral TID   Chlorhexidine  Gluconate Cloth  6 each Topical Daily   citalopram   40 mg Oral Daily   feeding supplement  237 mL Oral BID BM   heparin   5,000 Units Subcutaneous Q8H   levothyroxine   150 mcg Oral Q0600   potassium chloride   40 mEq Oral Q3H   senna-docusate  1 tablet Oral QHS   simvastatin   20 mg Oral QPM   Continuous Infusions:  dextrose  75 mL/hr at 09/13/23 0845   PRN Meds:.ALPRAZolam , bisacodyl , hydrALAZINE , loperamide , ondansetron  **OR** ondansetron  (ZOFRAN ) IV, oxyCODONE -acetaminophen  **AND** oxyCODONE   Current Outpatient Medications  Medication Instructions   ALPRAZolam  (XANAX ) 0.5 MG tablet Take 0.5-1 tablets (0.25-0.5 mg total) by mouth 3 (three) times daily as needed for anxiety.   benazepril  (LOTENSIN ) 40 mg, Oral, Daily   bimatoprost (LUMIGAN) 0.01 % SOLN 1 drop, Daily   calcium carbonate (OS-CAL - DOSED IN MG OF ELEMENTAL CALCIUM) 1250 MG tablet 1 tablet, Oral, Daily,     citalopram  (CELEXA ) 40 mg, Oral, Daily  Docusate Calcium (STOOL SOFTENER PO) 1 capsule, 2 times daily PRN   dorzolamide-timolol (COSOPT) 22.3-6.8 MG/ML ophthalmic solution 1 drop, 2 times daily   furosemide  (LASIX ) 20 mg, Oral, Daily PRN   hydrochlorothiazide  (HYDRODIURIL ) 25 mg, Oral, Every morning    latanoprost (XALATAN) 0.005 % ophthalmic solution 1 drop, Both Eyes, Daily at bedtime,     levothyroxine  (SYNTHROID ) 150 MCG tablet Take 1 tablet (150 mcg total) by mouth every morning on an empty stomach.   meloxicam  (MOBIC ) 15 mg, Oral, Daily   Multiple Vitamins-Minerals (MULTIVITAL PO) 1 tablet, Daily   oxyCODONE -acetaminophen  (PERCOCET) 10-325 MG tablet 1 tablet, Oral, Every 6 hours   simvastatin  (ZOCOR ) 20 mg, Oral, Every evening   TART CHERRY PO 1 tablet, Daily    Diet Orders (From admission, onward)     Start     Ordered   09/13/23 9357  Diet regular Room service appropriate? Yes; Fluid consistency: Thin  Diet effective now       Question Answer Comment  Room service appropriate? Yes   Fluid consistency: Thin      09/13/23 0642            DVT prophylaxis: heparin  injection 5,000 Units Start: 09/10/23 1415   Lab Results  Component Value Date   PLT 428 (H) 09/13/2023      Code Status: Limited: Do not attempt resuscitation (DNR) -DNR-LIMITED -Do Not Intubate/DNI   Family Communication: No family at bedside  Status is: Inpatient Remains inpatient appropriate because: Severity of illness   Level of care: Med-Surg  Consultants:  None  Objective: Vitals:   09/12/23 0723 09/12/23 1615 09/12/23 2000 09/13/23 0454  BP: (!) 126/56 134/61 130/61 (!) 140/55  Pulse: 66 63 (!) 58 60  Resp: 18 18 17 18   Temp: 97.8 F (36.6 C) 97.8 F (36.6 C) 97.7 F (36.5 C) 98.3 F (36.8 C)  TempSrc: Oral Oral Oral Oral  SpO2:   99% 96%  Weight:      Height:        Intake/Output Summary (Last 24 hours) at 09/13/2023 0948 Last data filed at 09/13/2023 0400 Gross per 24 hour  Intake 480 ml  Output 1375 ml  Net -895 ml   Wt Readings from Last 3 Encounters:  09/10/23 95.3 kg  11/03/22 102.1 kg  04/09/13 102.1 kg    Examination:  Constitutional: NAD Eyes: lids and conjunctivae normal, no scleral icterus ENMT: mmm Neck: normal, supple Respiratory: clear to auscultation  bilaterally, no wheezing, no crackles. Normal respiratory effort.  Cardiovascular: Regular rate and rhythm, no murmurs / rubs / gallops. No LE edema. Abdomen: soft, no distention, no tenderness. Bowel sounds positive.   Data Reviewed: I have independently reviewed following labs and imaging studies   CBC Recent Labs  Lab 09/10/23 1041 09/11/23 1210 09/13/23 0440  WBC 12.0* 9.5 11.6*  HGB 11.9* 10.7* 10.8*  HCT 36.0 32.3* 32.7*  PLT 502* 419* 428*  MCV 93.3 94.4 94.8  MCH 30.8 31.3 31.3  MCHC 33.1 33.1 33.0  RDW 13.5 13.4 13.6    Recent Labs  Lab 09/10/23 1041 09/10/23 1601 09/11/23 1210 09/12/23 0835 09/13/23 0440  NA 135  --  138 139 147*  K 3.5  --  2.7* 2.8* 3.0*  CL 98  --  106 108 111  CO2 22  --  21* 19* 21*  GLUCOSE 114*  --  81 84 121*  BUN 77*  --  49* 42* 31*  CREATININE 2.41*  --  1.45*  1.28* 1.18*  CALCIUM 9.1  --  8.5* 8.5* 8.9  AST 34  --   --   --   --   ALT 22  --   --   --   --   ALKPHOS 96  --   --   --   --   BILITOT 0.6  --   --   --   --   ALBUMIN 2.9*  --   --   --   --   MG  --  1.9  --   --  2.0  TSH  --  2.854  --   --   --     ------------------------------------------------------------------------------------------------------------------ No results for input(s): CHOL, HDL, LDLCALC, TRIG, CHOLHDL, LDLDIRECT in the last 72 hours.  No results found for: HGBA1C ------------------------------------------------------------------------------------------------------------------ Recent Labs    09/10/23 1601  TSH 2.854    Cardiac Enzymes No results for input(s): CKMB, TROPONINI, MYOGLOBIN in the last 168 hours.  Invalid input(s): CK ------------------------------------------------------------------------------------------------------------------ No results found for: BNP  CBG: Recent Labs  Lab 09/10/23 1042  GLUCAP 101*    Recent Results (from the past 240 hours)  Gastrointestinal Panel by PCR , Stool      Status: None   Collection Time: 09/11/23  6:55 AM   Specimen: STOOL  Result Value Ref Range Status   Campylobacter species NOT DETECTED NOT DETECTED Final   Plesimonas shigelloides NOT DETECTED NOT DETECTED Final   Salmonella species NOT DETECTED NOT DETECTED Final   Yersinia enterocolitica NOT DETECTED NOT DETECTED Final   Vibrio species NOT DETECTED NOT DETECTED Final   Vibrio cholerae NOT DETECTED NOT DETECTED Final   Enteroaggregative E coli (EAEC) NOT DETECTED NOT DETECTED Final   Enteropathogenic E coli (EPEC) NOT DETECTED NOT DETECTED Final   Enterotoxigenic E coli (ETEC) NOT DETECTED NOT DETECTED Final   Shiga like toxin producing E coli (STEC) NOT DETECTED NOT DETECTED Final   Shigella/Enteroinvasive E coli (EIEC) NOT DETECTED NOT DETECTED Final   Cryptosporidium NOT DETECTED NOT DETECTED Final   Cyclospora cayetanensis NOT DETECTED NOT DETECTED Final   Entamoeba histolytica NOT DETECTED NOT DETECTED Final   Giardia lamblia NOT DETECTED NOT DETECTED Final   Adenovirus F40/41 NOT DETECTED NOT DETECTED Final   Astrovirus NOT DETECTED NOT DETECTED Final   Norovirus GI/GII NOT DETECTED NOT DETECTED Final   Rotavirus A NOT DETECTED NOT DETECTED Final   Sapovirus (I, II, IV, and V) NOT DETECTED NOT DETECTED Final    Comment: Performed at [facility], [address] [place], [place] [address]  C Difficile Quick Screen w PCR reflex     Status: None   Collection Time: 09/11/23  6:55 AM   Specimen: STOOL  Result Value Ref Range Status   C Diff antigen NEGATIVE NEGATIVE Final   C Diff toxin NEGATIVE NEGATIVE Final   C Diff interpretation No C. difficile detected.  Final    Comment: Performed at [facility], [address], [place], [place] [address]     Radiology Studies: No results found.    Nilda Fendt, MD, PhD [facility]  Between 7 am - 7 pm I am available, please contact me via Amion (for emergencies) or Securechat (non urgent  messages)  Between 7 pm - 7 am I am not available, please contact night coverage MD/APP via Amion

## 2023-09-13 NOTE — Progress Notes (Signed)
 RE:  Megan Harmon       Date of Birth:  05/05/2032     Date:   09/13/23       To Whom It May Concern:  Please be advised that the above-named patient will require a short-term nursing home stay - anticipated 30 days or less for rehabilitation and strengthening.  The plan is for return home.                 MD signature                Date

## 2023-09-13 NOTE — TOC Progression Note (Addendum)
 Transition of Care [facility]) - Progression Note    Patient Details  Name: Megan Harmon MRN: [id number] Date of Birth: 16-Aug-1932  [facility] CM/SW Contact  Bridget Cordella Simmonds, LCSW Phone Number: 09/13/2023, 10:59 AM  Clinical Narrative:   PASSR requested, additional info uploaded to [facility].  CSW provided bed offers to pt, son [name], daughter Lenward on medicare choice document.  They are requesting response from [facility].  Referral was not sent there, referral now sent and CSW reached out to [facility] to review.  1115: [facility] does offer bed.  SNF auth request: CSW left message with [facility] requesting auth.      1345: TC Connie/HTA: SNF auth approved: 7 days, [id number].  PTAR was not requested per PT note. Per Jori, this will be 2 weeks or less               Expected Discharge Plan and Services                                               Social Drivers of Health (SDOH) Interventions SDOH Screenings   Food Insecurity: No Food Insecurity (09/10/2023)  Housing: Low Risk  (09/10/2023)  Transportation Needs: No Transportation Needs (09/10/2023)  Utilities: Not At Risk (09/10/2023)  Social Connections: Patient Unable To Answer (09/10/2023)  Tobacco Use: Low Risk  (09/10/2023)    Readmission Risk Interventions     No data to display

## 2023-09-13 NOTE — Progress Notes (Signed)
 Patient presented with bowel movement thus morning. Patient was cleaned and foley care given.  Fredie LITTIE Morones, RN

## 2023-09-13 NOTE — Progress Notes (Signed)
 Mobility Specialist Progress Note:    09/13/23 1032  Mobility  Activity Pivoted/transferred to/from [facility]  Level of Assistance Minimal assist, patient does 75% or more  Assistive Device Front wheel walker  Distance Ambulated (ft) 3 ft  Activity Response Tolerated well  Mobility Referral Yes  Mobility visit 1 Mobility  Mobility Specialist Start Time (ACUTE ONLY) 0859  Mobility Specialist Stop Time (ACUTE ONLY) 0929  Mobility Specialist Time Calculation (min) (ACUTE ONLY) 30 min   Received pt in bed and agreeable to mobility. Prior to mobility, pt had BM; MS assisted with pericare. Required MinA to EOB and STS; then transferred to [facility] where pt had another BM. MS assisted with pericare. Pt returned to bed. Personal belongings and call light within reach. All needs met.  Lavanda Pollack Mobility Specialist  Please contact via [employer] or  Rehab Office [phone]

## 2023-09-13 NOTE — Progress Notes (Signed)
 Chaplain responded to spiritual care consult. Pt Megan Harmon welcomed me into the room and we chatted for a while. I created space for her to share her emotions regarding the current hospitalization and her relationship with her husband.  She stated she has been well taken care of by her daughter and son, and couldn't ask for better care from the medical team here at [facility]. At the same time, she expressed the desire to return home as soon as possible, as being here is not good for her nerves. She doesn't recall why she's in the hospital.  On the topic of the loss of her husband, she appeared somewhat guarded against going too deep, either out of emotional discomfort or due to some cognitive decline. She simply kept repeating that God will take care of her and her family has been taking care of her. I gently encouraged her to remember to take care of herself as well, as she and her husband [name] were married for over 60 years.  Pt expressed gratitude for chaplain presence. She was open to prayer, and chaplains remain available as needs arise, including for family.

## 2023-09-13 NOTE — Progress Notes (Signed)
 Physical Therapy Treatment Patient Details Name: Megan Harmon MRN: [id number] DOB: February 27, 1932 Today's Date: 09/13/2023   History of Present Illness Megan Harmon is a 89 y.o. female who presented to the ED 8/29 for evaluation of generalized weakness. Found to have AKI and Acute metabolic encephalopathy possibly due to AKI.PHMX: anxiety and depression, osteoarthritis, hypertension and hypothyroidism.    PT Comments  The pt is making good, gradual progress towards her PT goals. She reports stiffness and pain in her shoulders, impacting her ease with pushing herself up to sit up EOB from supine, needing minA to ascend her trunk. She also displays deficits in bil quadriceps strength, resulting in her needing minA and the EOB elevated to transfer sit to stand and resulting in her poor eccentric control when transferring stand to sit. She was able to ambulate an increased distance of up to ~48 ft before fatiguing, only needing RW support and CGA for safety. However, she needs cues for upright posture, feet clearance, and proximity to her RW. Will continue to follow acutely.     If plan is discharge home, recommend the following: A little help with walking and/or transfers;A little help with bathing/dressing/bathroom;Assistance with cooking/housework;Assist for transportation;Help with stairs or ramp for entrance   Can travel by private vehicle     Yes  Equipment Recommendations  None recommended by PT    Recommendations for Other Services       Precautions / Restrictions Precautions Precautions: Fall Recall of Precautions/Restrictions: Impaired Restrictions Weight Bearing Restrictions Per Provider Order: No     Mobility  Bed Mobility Overal bed mobility: Needs Assistance Bed Mobility: Supine to Sit     Supine to sit: HOB elevated, Min assist     General bed mobility comments: Pt needed minA at trunk to ascend and sit up, cuing pt to bring legs off EOB and get L elbow under  her to push up to sit L EOB.    Transfers Overall transfer level: Needs assistance Equipment used: Rolling walker (2 wheels) Transfers: Sit to/from Stand Sit to Stand: Min assist, From elevated surface           General transfer comment: EOB elevated to simulate use of lift chair at home. Cues provided to scoot to edge and bring feet under her. MinA needed to power up to stand and gain balance. Extra time to extend hips and trunk    Ambulation/Gait Ambulation/Gait assistance: Contact guard assist Gait Distance (Feet): 48 Feet Assistive device: Rolling walker (2 wheels) Gait Pattern/deviations: Step-through pattern, Decreased stride length, Shuffle, Trunk flexed, Decreased dorsiflexion - right, Decreased dorsiflexion - left Gait velocity: dec Gait velocity interpretation: <1.31 ft/sec, indicative of household ambulator   General Gait Details: Pt ambulates with a flexed posture and with decreased bil feet clearance. VCs provided for upright posture and bil feet clearance and to remain proximal within her RW. No LOB, CGA for safety   Stairs             Wheelchair Mobility     Tilt Bed    Modified Rankin (Stroke Patients Only)       Balance Overall balance assessment: Needs assistance Sitting-balance support: No upper extremity supported Sitting balance-Leahy Scale: Fair Sitting balance - Comments: supervision sitting statically EOB   Standing balance support: Bilateral upper extremity supported, During functional activity, Reliant on assistive device for balance Standing balance-Leahy Scale: Poor Standing balance comment: reliant on RW  Communication Communication Communication: Impaired Factors Affecting Communication: Hearing impaired (has hearing aids)  Cognition Arousal: Alert Behavior During Therapy: Flat affect   PT - Cognitive impairments: Safety/Judgement                       PT - Cognition Comments:  Pt needs encouragement to push herself at times. Processing and initiation time likely impacted by being [facility] Following commands: Impaired Following commands impaired: Follows one step commands with increased time, Follows multi-step commands with increased time    Cueing Cueing Techniques: Verbal cues, Tactile cues  Exercises General Exercises - Lower Extremity Long Arc Quad: AROM, Strengthening, Both, 10 reps, Seated (with eccentric control)    General Comments        Pertinent Vitals/Pain Pain Assessment Pain Assessment: Faces Faces Pain Scale: Hurts little more Pain Location: shoulders (chronic) Pain Descriptors / Indicators: Discomfort, Grimacing, Guarding, Sore Pain Intervention(s): Limited activity within patient's tolerance, Monitored during session, Repositioned, Patient requesting pain meds-RN notified    Home Living                          Prior Function            PT Goals (current goals can now be found in the care plan section) Acute Rehab PT Goals Patient Stated Goal: get well PT Goal Formulation: With patient/family Time For Goal Achievement: 09/24/23 Potential to Achieve Goals: Good Progress towards PT goals: Progressing toward goals    Frequency    Min 2X/week      PT Plan      Co-evaluation              AM-PAC PT 6 Clicks Mobility   Outcome Measure  Help needed turning from your back to your side while in a flat bed without using bedrails?: A Little Help needed moving from lying on your back to sitting on the side of a flat bed without using bedrails?: A Little Help needed moving to and from a bed to a chair (including a wheelchair)?: A Little Help needed standing up from a chair using your arms (e.g., wheelchair or bedside chair)?: A Little Help needed to walk in hospital room?: A Little Help needed climbing 3-5 steps with a railing? : A Lot 6 Click Score: 17    End of Session Equipment Utilized During Treatment: Gait  belt Activity Tolerance: Patient tolerated treatment well Patient left: in chair;with call bell/phone within reach;with chair alarm set;with family/visitor present Nurse Communication: Patient requests pain meds PT Visit Diagnosis: Unsteadiness on feet (R26.81);Other abnormalities of gait and mobility (R26.89);Muscle weakness (generalized) (M62.81);Difficulty in walking, not elsewhere classified (R26.2)     Time: 9064-9045 PT Time Calculation (min) (ACUTE ONLY): 19 min  Charges:    $Therapeutic Activity: 8-22 mins PT General Charges $$ ACUTE PT VISIT: 1 Visit                     Theo Ferretti, PT, DPT Acute Rehabilitation Services  Office: [phone]    Theo CHRISTELLA Ferretti 09/13/2023, 10:03 AM

## 2023-09-14 DIAGNOSIS — N179 Acute kidney failure, unspecified: Secondary | ICD-10-CM | POA: Diagnosis not present

## 2023-09-14 LAB — BASIC METABOLIC PANEL WITH GFR
Anion gap: 11 (ref 5–15)
BUN: 20 mg/dL (ref 8–23)
CO2: 22 mmol/L (ref 22–32)
Calcium: 8.6 mg/dL — ABNORMAL LOW (ref 8.9–10.3)
Chloride: 109 mmol/L (ref 98–111)
Creatinine, Ser: 1.07 mg/dL — ABNORMAL HIGH (ref 0.44–1.00)
GFR, Estimated: 50 mL/min — ABNORMAL LOW (ref >60)
Glucose, Bld: 102 mg/dL — ABNORMAL HIGH (ref 70–99)
Potassium: 2.9 mmol/L — ABNORMAL LOW (ref 3.5–5.1)
Sodium: 142 mmol/L (ref 135–145)

## 2023-09-14 LAB — MAGNESIUM: Magnesium: 1.7 mg/dL (ref 1.7–2.4)

## 2023-09-14 MED ORDER — MIRTAZAPINE 15 MG PO TABS
7.5000 mg | ORAL_TABLET | Freq: Every day | ORAL | Status: DC
  Administered 2023-09-14 – 2023-09-17 (×4): 7.5 mg via ORAL
  Filled 2023-09-14 (×4): qty 1

## 2023-09-14 MED ORDER — MAGNESIUM SULFATE 4 GM/100ML IV SOLN
4.0000 g | Freq: Once | INTRAVENOUS | Status: AC
  Administered 2023-09-14: 4 g via INTRAVENOUS
  Filled 2023-09-14: qty 100

## 2023-09-14 MED ORDER — POTASSIUM CHLORIDE CRYS ER 20 MEQ PO TBCR
40.0000 meq | EXTENDED_RELEASE_TABLET | ORAL | Status: AC
Start: 2023-09-14 — End: 2023-09-14
  Administered 2023-09-14 (×3): 40 meq via ORAL
  Filled 2023-09-14 (×2): qty 2

## 2023-09-14 NOTE — TOC Initial Note (Signed)
 Transition of Care [facility]) - Initial/Assessment Note    Patient Details  Name: Megan Harmon MRN: [id number] Date of Birth: [birth year]  Transition of Care [facility]) CM/SW Contact:    Lauraine FORBES Saa, LCSWA Phone Number: 09/14/2023, 10:05 AM  Clinical Narrative:                  10:05 AM Per MD, patient is expected to discharge to SNF tomorrow. [facility] SNF admissions and bedside RN made aware. PASRR remains pending.  Expected Discharge Plan: Skilled Nursing Facility Barriers to Discharge: Continued Medical Work up, Awaiting State Approval (PASRR)   Patient Goals and CMS Choice   CMS Medicare.gov Compare Post Acute Care list provided to:: Patient Represenative (must comment) Choice offered to / list presented to : Adult Children      Expected Discharge Plan and Services In-house Referral: Clinical Social Work   Post Acute Care Choice: Skilled Nursing Facility Living arrangements for the past 2 months: Single Family Home                                      Prior Living Arrangements/Services Living arrangements for the past 2 months: Single Family Home Lives with:: Self Patient language and need for interpreter reviewed:: Yes        Need for Family Participation in Patient Care: Yes (Comment)     Criminal Activity/Legal Involvement Pertinent to Current Situation/Hospitalization: No - Comment as needed  Activities of Daily Living   ADL Screening (condition at time of admission) Independently performs ADLs?: No Does the patient have a NEW difficulty with bathing/dressing/toileting/self-feeding that is expected to last >3 days?: Yes (Initiates electronic notice to provider for possible OT consult) Does the patient have a NEW difficulty with getting in/out of bed, walking, or climbing stairs that is expected to last >3 days?: Yes (Initiates electronic notice to provider for possible PT consult) Does the patient have a NEW difficulty with communication that is  expected to last >3 days?: Yes (Initiates electronic notice to provider for possible SLP consult) Is the patient deaf or have difficulty hearing?: Yes Does the patient have difficulty seeing, even when wearing glasses/contacts?: No Does the patient have difficulty concentrating, remembering, or making decisions?: Yes  Permission Sought/Granted Permission sought to share information with : Family Supports, Oceanographer granted to share information with : No (Contact information on chart)  Share Information with NAME: Iyonna Rish.  Permission granted to share info w AGENCY: SNF  Permission granted to share info w Relationship: Son  Permission granted to share info w Contact Information: [phone]  Emotional Assessment   Attitude/Demeanor/Rapport: Unable to Assess Affect (typically observed): Unable to Assess Orientation: : Oriented to Self, Oriented to Place Alcohol / Substance Use: Not Applicable Psych Involvement: No (comment)  Admission diagnosis:  Dehydration [E86.0] Failure to thrive in adult [R62.7] Generalized weakness [R53.1] AKI (acute kidney injury) (HCC) [N17.9] Moderate protein-calorie malnutrition (HCC) [E44.0] Callous ulcer, unspecified ulcer stage (HCC) [L98.499] Patient Active Problem List   Diagnosis Date Noted   Essential hypertension 09/10/2023   Gastroesophageal reflux disease 09/10/2023   Glaucoma 09/10/2023   Hypothyroidism 09/10/2023   Mixed hyperlipidemia 09/10/2023   Obesity 09/10/2023   Osteoarthritis 09/10/2023   Senile osteopenia 09/10/2023   AKI (acute kidney injury) (HCC) 09/10/2023   Generalized weakness 09/10/2023   Failure to thrive in adult 09/10/2023   Dehydration 09/10/2023  Abdominal distention 09/10/2023   Normocytic anemia 09/10/2023   Pressure injury of skin 09/10/2023   PCP:  Loreli Kins, MD Pharmacy:   [facility] [phone] - [address] [place] - [address] [place] [address] Phone: [phone] Fax: [phone]  [facility] [address] [place] [address] Phone: [phone] Fax: [phone]  [facility] [address] [place] [address] Phone: [phone] Fax: [phone]     Social Drivers of Health (SDOH) Social History: SDOH Screenings   Food Insecurity: No Food Insecurity (09/10/2023)  Housing: Low Risk  (09/10/2023)  Transportation Needs: No Transportation Needs (09/10/2023)  Utilities: Not At Risk (09/10/2023)  Social Connections: Patient Unable To Answer (09/10/2023)  Tobacco Use: Low Risk  (09/10/2023)   SDOH Interventions:     Readmission Risk Interventions     No data to display

## 2023-09-14 NOTE — Progress Notes (Signed)
 Mobility Specialist Progress Note:    09/14/23 0946  Mobility  Activity Pivoted/transferred to/from [facility];Pivoted/transferred from bed to chair  Level of Assistance Minimal assist, patient does 75% or more  Assistive Device Front wheel walker  Distance Ambulated (ft) 10 ft  Activity Response Tolerated well  Mobility Referral Yes  Mobility visit 1 Mobility  Mobility Specialist Start Time (ACUTE ONLY) 0845  Mobility Specialist Stop Time (ACUTE ONLY) 0900  Mobility Specialist Time Calculation (min) (ACUTE ONLY) 15 min   Received pt in bed and agreeable to mobility. Pt required MinA to STS, otherwise contact guard. Pt requested to use the BSC, voided unsuccessfully. No c/o. Pt ambulated around bed to the chair. Pt left in chair. Personal belongings and call light within reach. All needs met.   Megan Harmon Mobility Specialist  Please contact via [employer] or  Rehab Office [phone]

## 2023-09-14 NOTE — Progress Notes (Signed)
 PROGRESS NOTE  Megan Harmon FMW:[id number] DOB: [birth year] DOA: 09/10/2023 PCP: Loreli Kins, MD   LOS: 4 days   Brief Narrative / Interim history: 89 y.o. female with medical history significant for anxiety and depression, osteoarthritis, hypertension and hypothyroidism who presented to the ED for evaluation of generalized weakness. Per son, patient lost her spouse 4 months ago as she has had gradual decline in health with good and bad days. Patient currently lives alone and ambulates with a walker with family close by and monitoring closely via camera. Over the last 2 days, they have noticed patient has had significantly decrease in her mobility. Around 3 AM this morning, they noticed that patient was sitting on something and unable to get up. Son reports that when he checked on patient, she had been sitting on the bottom of her lift chair for hours and had significant difficulty getting up or moving her feet.  Patient has also had recent decrease food and water intake as well as watery stools over the last 2 weeks.   Subjective / 24h Interval events: Remains without complaints, specifically she denies any chest pain, no abdominal pain, no nausea or vomiting.  Assesement and Plan: Principal Problem:   AKI (acute kidney injury) (HCC) Active Problems:   Generalized weakness   Failure to thrive in adult   Dehydration   Abdominal distention   Normocytic anemia   Pressure injury of skin   Principal problem Acute kidney injury-in the setting of her home medication with diuretics, ACE inhibitor, and poor p.o. intake over the last few weeks.  Creatinine normalized with fluids  Active problems Generalized weakness, failure to thrive-PT consulted, needs SNF.  Remains with poor p.o. intake, start Remeron   Hypernatremia-suspect due to poor p.o. intake, received D5W, sodium now normalized  Acute metabolic encephalopathy-has mild intermittent confusion, possibly due to #1 versus mild  undiagnosed dementia at home that gotten worse in the hospital.  She also lost her spouse about 4 months ago, has probably underlying worsening depression.  Start Remeron  now  Diarrheal illness-CT scan without significant acute findings other than evidence of diarrheal illness.  ED for GI pathogen panel both negative.  Suspect nonspecific, start Imodium   Essential hypertension-remains normotensive  Hypokalemia-remains persistently hypokalemic, continue to replace aggressively, replace magnesium  as well  Hyperlipidemia-continue statin  Hypothyroidism-continue Synthroid , TSH normal  Depression-continue citalopram   Iron deficiency anemia-start supplementation  Mild rhabdomyolysis-CK 429.   Acute urinary retention-due to immobility, placed Foley.  Will need to have voiding trial once she is more mobile  Scheduled Meds:  acetaminophen   1,000 mg Oral TID   Chlorhexidine  Gluconate Cloth  6 each Topical Daily   citalopram   40 mg Oral Daily   feeding supplement  237 mL Oral BID BM   heparin   5,000 Units Subcutaneous Q8H   levothyroxine   150 mcg Oral Q0600   mirtazapine   7.5 mg Oral QHS   potassium chloride   40 mEq Oral Q3H   senna-docusate  1 tablet Oral QHS   simvastatin   20 mg Oral QPM   Continuous Infusions:  magnesium  sulfate bolus IVPB     PRN Meds:.ALPRAZolam , bisacodyl , hydrALAZINE , loperamide , ondansetron  **OR** ondansetron  (ZOFRAN ) IV, oxyCODONE -acetaminophen  **AND** oxyCODONE   Current Outpatient Medications  Medication Instructions   ALPRAZolam  (XANAX ) 0.5 MG tablet Take 0.5-1 tablets (0.25-0.5 mg total) by mouth 3 (three) times daily as needed for anxiety.   benazepril  (LOTENSIN ) 40 mg, Oral, Daily   bimatoprost (LUMIGAN) 0.01 % SOLN 1 drop, Daily   calcium carbonate (OS-CAL -  DOSED IN MG OF ELEMENTAL CALCIUM) 1250 MG tablet 1 tablet, Oral, Daily,     citalopram  (CELEXA ) 40 mg, Oral, Daily   Docusate Calcium (STOOL SOFTENER PO) 1 capsule, 2 times daily PRN    dorzolamide-timolol (COSOPT) 22.3-6.8 MG/ML ophthalmic solution 1 drop, 2 times daily   furosemide  (LASIX ) 20 mg, Oral, Daily PRN   hydrochlorothiazide  (HYDRODIURIL ) 25 mg, Oral, Every morning   latanoprost (XALATAN) 0.005 % ophthalmic solution 1 drop, Both Eyes, Daily at bedtime,     levothyroxine  (SYNTHROID ) 150 MCG tablet Take 1 tablet (150 mcg total) by mouth every morning on an empty stomach.   meloxicam  (MOBIC ) 15 mg, Oral, Daily   Multiple Vitamins-Minerals (MULTIVITAL PO) 1 tablet, Daily   oxyCODONE -acetaminophen  (PERCOCET) 10-325 MG tablet 1 tablet, Oral, Every 6 hours   simvastatin  (ZOCOR ) 20 mg, Oral, Every evening   TART CHERRY PO 1 tablet, Daily    Diet Orders (From admission, onward)     Start     Ordered   09/13/23 9357  Diet regular Room service appropriate? Yes; Fluid consistency: Thin  Diet effective now       Question Answer Comment  Room service appropriate? Yes   Fluid consistency: Thin      09/13/23 0642            DVT prophylaxis: heparin  injection 5,000 Units Start: 09/10/23 1415   Lab Results  Component Value Date   PLT 428 (H) 09/13/2023      Code Status: Limited: Do not attempt resuscitation (DNR) -DNR-LIMITED -Do Not Intubate/DNI   Family Communication: No family at bedside  Status is: Inpatient Remains inpatient appropriate because: Severity of illness   Level of care: Med-Surg  Consultants:  None  Objective: Vitals:   09/13/23 1538 09/13/23 1958 09/14/23 0431 09/14/23 0748  BP: (!) 135/53 134/64 (!) 138/59 (!) 142/62  Pulse: 63 64 67 60  Resp: (!) 21 17 17 18   Temp: 98.4 F (36.9 C) 98.2 F (36.8 C) 98.7 F (37.1 C) 98.5 F (36.9 C)  TempSrc: Oral Oral Oral Oral  SpO2: 99% 98% 93% 96%  Weight:      Height:        Intake/Output Summary (Last 24 hours) at 09/14/2023 0951 Last data filed at 09/14/2023 0431 Gross per 24 hour  Intake 218.29 ml  Output 300 ml  Net -81.71 ml   Wt Readings from Last 3 Encounters:  09/10/23  95.3 kg  11/03/22 102.1 kg  04/09/13 102.1 kg    Examination:  Constitutional: NAD Eyes: lids and conjunctivae normal, no scleral icterus ENMT: mmm Neck: normal, supple Respiratory: clear to auscultation bilaterally, no wheezing, no crackles. Normal respiratory effort.  Cardiovascular: Regular rate and rhythm, no murmurs / rubs / gallops. No LE edema. Abdomen: soft, no distention, no tenderness. Bowel sounds positive.   Data Reviewed: I have independently reviewed following labs and imaging studies   CBC Recent Labs  Lab 09/10/23 1041 09/11/23 1210 09/13/23 0440  WBC 12.0* 9.5 11.6*  HGB 11.9* 10.7* 10.8*  HCT 36.0 32.3* 32.7*  PLT 502* 419* 428*  MCV 93.3 94.4 94.8  MCH 30.8 31.3 31.3  MCHC 33.1 33.1 33.0  RDW 13.5 13.4 13.6    Recent Labs  Lab 09/10/23 1041 09/10/23 1601 09/11/23 1210 09/12/23 0835 09/13/23 0440 09/14/23 0810  NA 135  --  138 139 147* 142  K 3.5  --  2.7* 2.8* 3.0* 2.9*  CL 98  --  106 108 111 109  CO2 22  --  21* 19* 21* 22  GLUCOSE 114*  --  81 84 121* 102*  BUN 77*  --  49* 42* 31* 20  CREATININE 2.41*  --  1.45* 1.28* 1.18* 1.07*  CALCIUM 9.1  --  8.5* 8.5* 8.9 8.6*  AST 34  --   --   --   --   --   ALT 22  --   --   --   --   --   ALKPHOS 96  --   --   --   --   --   BILITOT 0.6  --   --   --   --   --   ALBUMIN 2.9*  --   --   --   --   --   MG  --  1.9  --   --  2.0 1.7  TSH  --  2.854  --   --   --   --     ------------------------------------------------------------------------------------------------------------------ No results for input(s): CHOL, HDL, LDLCALC, TRIG, CHOLHDL, LDLDIRECT in the last 72 hours.  No results found for: HGBA1C ------------------------------------------------------------------------------------------------------------------ No results for input(s): TSH, T4TOTAL, T3FREE, THYROIDAB in the last 72 hours.  Invalid input(s): FREET3   Cardiac Enzymes No results for input(s):  CKMB, TROPONINI, MYOGLOBIN in the last 168 hours.  Invalid input(s): CK ------------------------------------------------------------------------------------------------------------------ No results found for: BNP  CBG: Recent Labs  Lab 09/10/23 1042  GLUCAP 101*    Recent Results (from the past 240 hours)  Gastrointestinal Panel by PCR , Stool     Status: None   Collection Time: 09/11/23  6:55 AM   Specimen: STOOL  Result Value Ref Range Status   Campylobacter species NOT DETECTED NOT DETECTED Final   Plesimonas shigelloides NOT DETECTED NOT DETECTED Final   Salmonella species NOT DETECTED NOT DETECTED Final   Yersinia enterocolitica NOT DETECTED NOT DETECTED Final   Vibrio species NOT DETECTED NOT DETECTED Final   Vibrio cholerae NOT DETECTED NOT DETECTED Final   Enteroaggregative E coli (EAEC) NOT DETECTED NOT DETECTED Final   Enteropathogenic E coli (EPEC) NOT DETECTED NOT DETECTED Final   Enterotoxigenic E coli (ETEC) NOT DETECTED NOT DETECTED Final   Shiga like toxin producing E coli (STEC) NOT DETECTED NOT DETECTED Final   Shigella/Enteroinvasive E coli (EIEC) NOT DETECTED NOT DETECTED Final   Cryptosporidium NOT DETECTED NOT DETECTED Final   Cyclospora cayetanensis NOT DETECTED NOT DETECTED Final   Entamoeba histolytica NOT DETECTED NOT DETECTED Final   Giardia lamblia NOT DETECTED NOT DETECTED Final   Adenovirus F40/41 NOT DETECTED NOT DETECTED Final   Astrovirus NOT DETECTED NOT DETECTED Final   Norovirus GI/GII NOT DETECTED NOT DETECTED Final   Rotavirus A NOT DETECTED NOT DETECTED Final   Sapovirus (I, II, IV, and V) NOT DETECTED NOT DETECTED Final    Comment: Performed at [facility], [address] [place], [place] [address]  C Difficile Quick Screen w PCR reflex     Status: None   Collection Time: 09/11/23  6:55 AM   Specimen: STOOL  Result Value Ref Range Status   C Diff antigen NEGATIVE NEGATIVE Final   C Diff toxin NEGATIVE NEGATIVE  Final   C Diff interpretation No C. difficile detected.  Final    Comment: Performed at [facility], [address] [place], [place] [address]     Radiology Studies: No results found.    Nilda Fendt, MD, PhD [facility]  Between 7 am - 7  pm I am available, please contact me via [employer] (for emergencies) or Securechat (non urgent messages)  Between 7 pm - 7 am I am not available, please contact night coverage MD/APP via [employer]

## 2023-09-14 NOTE — Plan of Care (Signed)

## 2023-09-15 DIAGNOSIS — E860 Dehydration: Secondary | ICD-10-CM | POA: Diagnosis not present

## 2023-09-15 DIAGNOSIS — R531 Weakness: Secondary | ICD-10-CM | POA: Diagnosis not present

## 2023-09-15 DIAGNOSIS — N179 Acute kidney failure, unspecified: Secondary | ICD-10-CM | POA: Diagnosis not present

## 2023-09-15 DIAGNOSIS — R140 Abdominal distension (gaseous): Secondary | ICD-10-CM | POA: Diagnosis not present

## 2023-09-15 LAB — COMPREHENSIVE METABOLIC PANEL WITH GFR
ALT: 30 U/L (ref 0–44)
AST: 27 U/L (ref 15–41)
Albumin: 2.4 g/dL — ABNORMAL LOW (ref 3.5–5.0)
Alkaline Phosphatase: 63 U/L (ref 38–126)
Anion gap: 8 (ref 5–15)
BUN: 19 mg/dL (ref 8–23)
CO2: 21 mmol/L — ABNORMAL LOW (ref 22–32)
Calcium: 8.4 mg/dL — ABNORMAL LOW (ref 8.9–10.3)
Chloride: 110 mmol/L (ref 98–111)
Creatinine, Ser: 0.94 mg/dL (ref 0.44–1.00)
GFR, Estimated: 58 mL/min — ABNORMAL LOW (ref >60)
Glucose, Bld: 84 mg/dL (ref 70–99)
Potassium: 3.4 mmol/L — ABNORMAL LOW (ref 3.5–5.1)
Sodium: 139 mmol/L (ref 135–145)
Total Bilirubin: 0.4 mg/dL (ref 0.0–1.2)
Total Protein: 5.2 g/dL — ABNORMAL LOW (ref 6.5–8.1)

## 2023-09-15 LAB — CBC
HCT: 31.6 % — ABNORMAL LOW (ref 36.0–46.0)
Hemoglobin: 10.5 g/dL — ABNORMAL LOW (ref 12.0–15.0)
MCH: 31.3 pg (ref 26.0–34.0)
MCHC: 33.2 g/dL (ref 30.0–36.0)
MCV: 94.3 fL (ref 80.0–100.0)
Platelets: 365 K/uL (ref 150–400)
RBC: 3.35 MIL/uL — ABNORMAL LOW (ref 3.87–5.11)
RDW: 14 % (ref 11.5–15.5)
WBC: 12.7 K/uL — ABNORMAL HIGH (ref 4.0–10.5)
nRBC: 0 % (ref 0.0–0.2)

## 2023-09-15 LAB — MAGNESIUM: Magnesium: 2.1 mg/dL (ref 1.7–2.4)

## 2023-09-15 MED ORDER — ALPRAZOLAM 0.5 MG PO TABS: 0.5000 mg | ORAL_TABLET | Freq: Two times a day (BID) | ORAL | 0 refills | Status: DC | PRN

## 2023-09-15 MED ORDER — BENAZEPRIL HCL 20 MG PO TABS
20.0000 mg | ORAL_TABLET | Freq: Every day | ORAL | Status: DC
  Administered 2023-09-15 – 2023-09-17 (×3): 20 mg via ORAL
  Filled 2023-09-15 (×4): qty 1

## 2023-09-15 MED ORDER — ENSURE PLUS HIGH PROTEIN PO LIQD: 237.0000 mL | Freq: Two times a day (BID) | ORAL | Status: DC

## 2023-09-15 MED ORDER — LOPERAMIDE HCL 2 MG PO CAPS: 2.0000 mg | ORAL_CAPSULE | ORAL | Status: AC | PRN

## 2023-09-15 MED ORDER — OXYCODONE-ACETAMINOPHEN 5-325 MG PO TABS: 1.0000 | ORAL_TABLET | Freq: Three times a day (TID) | ORAL | 0 refills | Status: DC | PRN

## 2023-09-15 MED ORDER — POTASSIUM CHLORIDE CRYS ER 20 MEQ PO TBCR
60.0000 meq | EXTENDED_RELEASE_TABLET | Freq: Once | ORAL | Status: AC
  Administered 2023-09-15: 60 meq via ORAL
  Filled 2023-09-15: qty 3

## 2023-09-15 MED ORDER — SENNOSIDES-DOCUSATE SODIUM 8.6-50 MG PO TABS: 2.0000 | ORAL_TABLET | Freq: Two times a day (BID) | ORAL | Status: AC | PRN

## 2023-09-15 MED ORDER — ACETAMINOPHEN 325 MG PO TABS: 650.0000 mg | ORAL_TABLET | Freq: Three times a day (TID) | ORAL | Status: AC

## 2023-09-15 MED ORDER — OXYCODONE-ACETAMINOPHEN 5-325 MG PO TABS
1.0000 | ORAL_TABLET | Freq: Two times a day (BID) | ORAL | Status: DC | PRN
  Administered 2023-09-15 – 2023-09-16 (×2): 1 via ORAL
  Filled 2023-09-15 (×2): qty 1

## 2023-09-15 NOTE — Plan of Care (Signed)

## 2023-09-15 NOTE — Progress Notes (Signed)
 Physical Therapy Treatment Patient Details Name: Megan Harmon MRN: [id number] DOB: 1932/07/05 Today's Date: 09/15/2023   History of Present Illness Megan Harmon is a 89 y.o. female who presented to the ED 8/29 for evaluation of generalized weakness. Found to have AKI and Acute metabolic encephalopathy possibly due to AKI.PHMX: anxiety and depression, osteoarthritis, hypertension and hypothyroidism.    PT Comments  Continuing work on functional mobility and activity tolerance;  session focused on progressive amb and transfer training; Pt was tired, but willing to work; continues to need cues for hand placement with sit<>stand and RW proximity; Noting with cues and repetition smoother rise with sit to stand practice; Due to pt current functional status, PLOF, home set up and available assistance at home recommending skilled physical therapy services< 3 hours/day on discharge from acute care hospital setting in order to decrease risk for falls, injury, immobility, skin break down and re-hospitalization.     If plan is discharge home, recommend the following: A little help with walking and/or transfers;A little help with bathing/dressing/bathroom;Assistance with cooking/housework;Assist for transportation;Help with stairs or ramp for entrance   Can travel by private vehicle     Yes  Equipment Recommendations  None recommended by PT    Recommendations for Other Services       Precautions / Restrictions Precautions Precautions: Fall Recall of Precautions/Restrictions: Impaired Restrictions Weight Bearing Restrictions Per Provider Order: No     Mobility  Bed Mobility                    Transfers Overall transfer level: Needs assistance Equipment used: Rolling walker (2 wheels) Transfers: Sit to/from Stand Sit to Stand: Min assist, Contact guard assist           General transfer comment: Stood from recliner and from visitor chair with armrests; multimodal cues to  initiate with anterior weight shift, and prep for sit to stand by pulling feet closer under base of support and scooting to EOB; cues also for hand placement and control of stand to sit    Ambulation/Gait Ambulation/Gait assistance: Contact guard assist Gait Distance (Feet): 15 Feet (x2) Assistive device: Rolling walker (2 wheels) Gait Pattern/deviations: Step-through pattern, Decreased stride length, Shuffle, Trunk flexed, Decreased dorsiflexion - right, Decreased dorsiflexion - left       General Gait Details: Pt ambulates with a flexed posture and with decreased bil feet clearance. VCs provided for upright posture and bil feet clearance and to remain proximal within her RW. No LOB, CGA for safety   Stairs             Wheelchair Mobility     Tilt Bed    Modified Rankin (Stroke Patients Only)       Balance     Sitting balance-Leahy Scale: Fair       Standing balance-Leahy Scale: Poor                              Communication Communication Communication: Impaired Factors Affecting Communication: Hearing impaired  Cognition Arousal: Alert Behavior During Therapy: Flat affect                             Following commands: Impaired Following commands impaired: Follows one step commands with increased time    Cueing Cueing Techniques: Verbal cues, Tactile cues  Exercises      General Comments General comments (skin integrity,  edema, etc.): Pt's daughter Lenward present and helpful      Pertinent Vitals/Pain Pain Assessment Pain Assessment: Faces Faces Pain Scale: No hurt Pain Intervention(s): Monitored during session    Home Living                          Prior Function            PT Goals (current goals can now be found in the care plan section) Acute Rehab PT Goals PT Goal Formulation: With patient/family Time For Goal Achievement: 09/24/23 Potential to Achieve Goals: Good Progress towards PT goals:  Progressing toward goals    Frequency    Min 2X/week      PT Plan      Co-evaluation              AM-PAC PT 6 Clicks Mobility   Outcome Measure  Help needed turning from your back to your side while in a flat bed without using bedrails?: A Little Help needed moving from lying on your back to sitting on the side of a flat bed without using bedrails?: A Little Help needed moving to and from a bed to a chair (including a wheelchair)?: A Little Help needed standing up from a chair using your arms (e.g., wheelchair or bedside chair)?: A Little Help needed to walk in hospital room?: A Little Help needed climbing 3-5 steps with a railing? : A Lot 6 Click Score: 17    End of Session Equipment Utilized During Treatment: Gait belt Activity Tolerance: Patient tolerated treatment well Patient left: in chair;with call bell/phone within reach;with chair alarm set;with family/visitor present Nurse Communication: Patient requests pain meds PT Visit Diagnosis: Unsteadiness on feet (R26.81);Other abnormalities of gait and mobility (R26.89);Muscle weakness (generalized) (M62.81);Difficulty in walking, not elsewhere classified (R26.2)     Time: 1318-1400 PT Time Calculation (min) (ACUTE ONLY): 42 min  Charges:    $Gait Training: 23-37 mins $Therapeutic Activity: 8-22 mins PT General Charges $$ ACUTE PT VISIT: 1 Visit                     Silvano Currier, PT  Acute Rehabilitation Services Office [phone] Secure Chat welcomed    Silvano VEAR Currier 09/15/2023, 5:21 PM

## 2023-09-15 NOTE — TOC Progression Note (Addendum)
 Transition of Care [facility]) - Progression Note    Patient Details  Name: Megan Harmon MRN: [id number] Date of Birth: 1932-06-11  [facility]) CM/SW Contact  Lauraine FORBES Saa, LCSWA Phone Number: 09/15/2023, 8:56 AM  Clinical Narrative:     8:57 AM Patient received PASRR [id number] E valid 09/14/23-10/14/23. [facility] SNF made aware.  12:25 PM Per MD, patient is not medically ready for discharge today but is expected to discharge tomorrow. [facility] SNF and bedside RN made aware.  Expected Discharge Plan: Skilled Nursing Facility Barriers to Discharge: Continued Medical Work up, Awaiting State Approval Financial controller)               Expected Discharge Plan and Services In-house Referral: Clinical Social Work   Post Acute Care Choice: Skilled Nursing Facility Living arrangements for the past 2 months: Single Family Home                                       Social Drivers of Health (SDOH) Interventions SDOH Screenings   Food Insecurity: No Food Insecurity (09/10/2023)  Housing: Low Risk  (09/10/2023)  Transportation Needs: No Transportation Needs (09/10/2023)  Utilities: Not At Risk (09/10/2023)  Social Connections: Patient Unable To Answer (09/10/2023)  Tobacco Use: Low Risk  (09/10/2023)    Readmission Risk Interventions     No data to display

## 2023-09-15 NOTE — Care Management Important Message (Signed)
 Important Message  Patient Details  Name: Megan Harmon MRN: [id number] Date of Birth: 1932-12-24   Important Message Given:  Yes - Medicare IM     Claretta Deed 09/15/2023, 8:01 AM

## 2023-09-15 NOTE — Progress Notes (Signed)
 PROGRESS NOTE  Megan Harmon FMW:[id number] DOB: 12-26-1932   PCP: Loreli Kins, MD  Patient is from: Home.  Lives alone.  Uses rolling walker  DOA: 09/10/2023 LOS: 5  Chief complaints Chief Complaint  Patient presents with   Weakness     Brief Narrative / Interim history: 89 year old F with PMH of HTN, anxiety, depression, osteoarthritis and hypothyroidism presented to ED with generalized weakness, abdominal distention, watery stool and decreased appetite.  Apparently, patient lost her spouse about 4 months ago and has had a gradual decline in her health. Patient currently lives alone and ambulates with a walker with family close by and monitoring closely via camera. Over the last 2 days, they have noticed patient has had significantly decrease in her mobility. Around 3 AM this morning, they noticed that patient was sitting on something and unable to get up. Son reports that when he checked on patient, she had been sitting on the bottom of her lift chair for hours and had significant difficulty getting up or moving her feet.  Patient was admitted for AKI, dehydration, hypokalemia, acute metabolic encephalopathy and generalized weakness.  AKI, dehydration and encephalopathy resolved with IV fluid hydration and adjustment of home medications.  Therapy recommended SNF.  Subjective: Seen and examined earlier this morning.  No major events overnight or this morning.  Denies chest pain, shortness of breath, cough, abdominal pain, nausea or vomiting.  Denies UTI symptoms but has Foley catheter in place.  She stated her appetite is lousy.  No further diarrhea.  Patient's daughter at bedside.  Objective: Vitals:   09/14/23 1831 09/14/23 1926 09/15/23 0426 09/15/23 0905  BP: 131/60 (!) 143/59 (!) 150/73 (!) 153/69  Pulse: (!) 59 68 70 69  Resp: 16 17    Temp: 97.7 F (36.5 C) 97.7 F (36.5 C) 98.9 F (37.2 C) 98.5 F (36.9 C)  TempSrc: Oral Oral Oral Oral  SpO2: 97% 99%  98%   Weight:      Height:        Examination:  GENERAL: No apparent distress.  Nontoxic. HEENT: MMM.  Vision and hearing grossly intact.  NECK: Supple.  No apparent JVD.  RESP:  No IWOB.  Fair aeration bilaterally. CVS:  RRR. Heart sounds normal.  ABD/GI/GU: BS+. Abd soft, NTND.  Foley catheter in place. MSK/EXT:  Moves extremities. No apparent deformity. No edema.  SKIN: no apparent skin lesion or wound NEURO: AA.  Oriented fairly.  No apparent focal neuro deficit. PSYCH: Calm. Normal affect.   Consultants:  None  Procedures: None  Microbiology summarized: C. difficile and GIP negative  Assessment and plan: Acute kidney injury/dehydration-in the setting of poor p.o. intake, diarrhea and concurrent use of diuretics and ACE inhibitors.  Renal ultrasound negative for hydronephrosis.  AKI resolved. -Encourage oral hydration -Discontinue diuretics indefinitely    Generalized weakness: Daughter concerned about patient's poor appetite. -Continue Remeron  -Liberate diet -Decrease narcotics - OOB, PT, OT  Acute metabolic encephalopathy: No formal diagnosis of dementia.  She is currently awake and alert oriented. - Minimize sedating medication - Reorientation and delirium precautions  Diarrheal illness-CT scan without significant acute findings other than evidence of diarrheal illness.  C. difficile and GIP negative.  No fever or leukocytosis.  Abdominal exam benign.  Overflow diarrhea?  Hypernatremia-suspect due to poor p.o. intake.  Resolved.   Essential hypertension-SBP in 150 -Resume home benazepril  at reduced dose -Continue holding diuretics   Hypokalemia-K3.4 -Monitor and replenish   Hyperlipidemia -continue statin   Hypothyroidism-TSH normal -  Continue Synthroid    Depression/grief: Concerned about episode of depression after losing her husband -Continue citalopram  -Low-dose Remeron  added here   Iron deficiency anemia- -continue supplementation   Mild  rhabdomyolysis-CK 429.    Acute urinary retention?  Could be due to immobility.   - Discontinue Foley.  Bladder scan every 6 hours.  Strict intake and output. - OOB, PT/OT   Chronic back pain - Continue scheduled Tylenol   - Decrease as needed oxycodone .  Class I obesity/poor appetite Body mass index is 33.89 kg/m. - Remeron  as above -Consult dietitian         DVT prophylaxis:  heparin  injection 5,000 Units Start: 09/10/23 1415  Code Status: DNR-Limited Family Communication: Updated patient's daughter at bedside Level of care: Med-Surg Status is: Inpatient Remains inpatient appropriate because: Poor appetite   Final disposition: SNF   55 minutes with more than 50% spent in reviewing records, counseling patient/family and coordinating care.   Sch Meds:  Scheduled Meds:  acetaminophen   1,000 mg Oral TID   Chlorhexidine  Gluconate Cloth  6 each Topical Daily   citalopram   40 mg Oral Daily   feeding supplement  237 mL Oral BID BM   heparin   5,000 Units Subcutaneous Q8H   levothyroxine   150 mcg Oral Q0600   mirtazapine   7.5 mg Oral QHS   senna-docusate  1 tablet Oral QHS   simvastatin   20 mg Oral QPM   Continuous Infusions: PRN Meds:.ALPRAZolam , bisacodyl , hydrALAZINE , loperamide , ondansetron  **OR** ondansetron  (ZOFRAN ) IV, oxyCODONE -acetaminophen  **AND** [DISCONTINUED] oxyCODONE   Antimicrobials: Anti-infectives (From admission, onward)    None        I have personally reviewed the following labs and images: CBC: Recent Labs  Lab 09/10/23 1041 09/11/23 1210 09/13/23 0440 09/15/23 0505  WBC 12.0* 9.5 11.6* 12.7*  HGB 11.9* 10.7* 10.8* 10.5*  HCT 36.0 32.3* 32.7* 31.6*  MCV 93.3 94.4 94.8 94.3  PLT 502* 419* 428* 365   BMP &GFR Recent Labs  Lab 09/10/23 1601 09/11/23 1210 09/12/23 0835 09/13/23 0440 09/14/23 0810 09/15/23 0505  NA  --  138 139 147* 142 139  K  --  2.7* 2.8* 3.0* 2.9* 3.4*  CL  --  106 108 111 109 110  CO2  --  21* 19* 21* 22  21*  GLUCOSE  --  81 84 121* 102* 84  BUN  --  49* 42* 31* 20 19  CREATININE  --  1.45* 1.28* 1.18* 1.07* 0.94  CALCIUM  --  8.5* 8.5* 8.9 8.6* 8.4*  MG 1.9  --   --  2.0 1.7 2.1  PHOS 3.1  --   --   --   --   --    Estimated Creatinine Clearance: 47.2 mL/min (by C-G formula based on SCr of 0.94 mg/dL). Liver & Pancreas: Recent Labs  Lab 09/10/23 1041 09/15/23 0505  AST 34 27  ALT 22 30  ALKPHOS 96 63  BILITOT 0.6 0.4  PROT 6.4* 5.2*  ALBUMIN 2.9* 2.4*   No results for input(s): LIPASE, AMYLASE in the last 168 hours. No results for input(s): AMMONIA in the last 168 hours. Diabetic: No results for input(s): HGBA1C in the last 72 hours. Recent Labs  Lab 09/10/23 1042  GLUCAP 101*   Cardiac Enzymes: Recent Labs  Lab 09/10/23 1601  CKTOTAL 429*   No results for input(s): PROBNP in the last 8760 hours. Coagulation Profile: No results for input(s): INR, PROTIME in the last 168 hours. Thyroid  Function Tests: No results for input(s): TSH,  T4TOTAL, FREET4, T3FREE, THYROIDAB in the last 72 hours. Lipid Profile: No results for input(s): CHOL, HDL, LDLCALC, TRIG, CHOLHDL, LDLDIRECT in the last 72 hours. Anemia Panel: No results for input(s): VITAMINB12, FOLATE, FERRITIN, TIBC, IRON, RETICCTPCT in the last 72 hours. Urine analysis:    Component Value Date/Time   COLORURINE YELLOW 09/10/2023 1054   APPEARANCEUR CLEAR 09/10/2023 1054   LABSPEC 1.015 09/10/2023 1054   PHURINE 5.0 09/10/2023 1054   GLUCOSEU NEGATIVE 09/10/2023 1054   HGBUR NEGATIVE 09/10/2023 1054   BILIRUBINUR NEGATIVE 09/10/2023 1054   KETONESUR NEGATIVE 09/10/2023 1054   PROTEINUR NEGATIVE 09/10/2023 1054   NITRITE NEGATIVE 09/10/2023 1054   LEUKOCYTESUR NEGATIVE 09/10/2023 1054   Sepsis Labs: Invalid input(s): PROCALCITONIN, LACTICIDVEN  Microbiology: Recent Results (from the past 240 hours)  Gastrointestinal Panel by PCR , Stool     Status: None    Collection Time: 09/11/23  6:55 AM   Specimen: STOOL  Result Value Ref Range Status   Campylobacter species NOT DETECTED NOT DETECTED Final   Plesimonas shigelloides NOT DETECTED NOT DETECTED Final   Salmonella species NOT DETECTED NOT DETECTED Final   Yersinia enterocolitica NOT DETECTED NOT DETECTED Final   Vibrio species NOT DETECTED NOT DETECTED Final   Vibrio cholerae NOT DETECTED NOT DETECTED Final   Enteroaggregative E coli (EAEC) NOT DETECTED NOT DETECTED Final   Enteropathogenic E coli (EPEC) NOT DETECTED NOT DETECTED Final   Enterotoxigenic E coli (ETEC) NOT DETECTED NOT DETECTED Final   Shiga like toxin producing E coli (STEC) NOT DETECTED NOT DETECTED Final   Shigella/Enteroinvasive E coli (EIEC) NOT DETECTED NOT DETECTED Final   Cryptosporidium NOT DETECTED NOT DETECTED Final   Cyclospora cayetanensis NOT DETECTED NOT DETECTED Final   Entamoeba histolytica NOT DETECTED NOT DETECTED Final   Giardia lamblia NOT DETECTED NOT DETECTED Final   Adenovirus F40/41 NOT DETECTED NOT DETECTED Final   Astrovirus NOT DETECTED NOT DETECTED Final   Norovirus GI/GII NOT DETECTED NOT DETECTED Final   Rotavirus A NOT DETECTED NOT DETECTED Final   Sapovirus (I, II, IV, and V) NOT DETECTED NOT DETECTED Final    Comment: Performed at [facility], [address] [place], [place] [address]  C Difficile Quick Screen w PCR reflex     Status: None   Collection Time: 09/11/23  6:55 AM   Specimen: STOOL  Result Value Ref Range Status   C Diff antigen NEGATIVE NEGATIVE Final   C Diff toxin NEGATIVE NEGATIVE Final   C Diff interpretation No C. difficile detected.  Final    Comment: Performed at [facility], [address], [place], [place] [address]    Radiology Studies: No results found.    Matisse Salais T. Solan Vosler [facility]  If 7PM-7AM, please contact night-coverage [email or web address] 09/15/2023, 1:08 PM

## 2023-09-15 NOTE — Progress Notes (Signed)
 Occupational Therapy Treatment Patient Details Name: Megan Harmon MRN: [id number] DOB: [birth year] Today's Date: 09/15/2023   History of present illness Megan Harmon is a 89 y.o. female who presented to the ED 8/29 for evaluation of generalized weakness. Found to have AKI and Acute metabolic encephalopathy possibly due to AKI.PHMX: anxiety and depression, osteoarthritis, hypertension and hypothyroidism.   OT comments  Worked with pt on promoting OOB activity tolerance and subsequently BLE strengthening in prep for mobility and OOB ADLs. Pt initially needing mod A to help stand from EOB, BLEs noted to slide forward, following exercise interventions and cues pt was able to progress to light min A STS. Pt fatigued by end of session following multiple STS and standing tolerance tasks, deferred ADLs to allow recovery prior to PT session. OT to continue efforts to progress pt as able.       If plan is discharge home, recommend the following:  A lot of help with walking and/or transfers;A lot of help with bathing/dressing/bathroom;Assistance with cooking/housework;Assistance with feeding;Help with stairs or ramp for entrance;Assist for transportation;Direct supervision/assist for financial management;Direct supervision/assist for medications management   Equipment Recommendations  Other (comment) (defer to next level of care)    Recommendations for Other Services      Precautions / Restrictions Precautions Precautions: Fall Recall of Precautions/Restrictions: Impaired Restrictions Weight Bearing Restrictions Per Provider Order: No       Mobility Bed Mobility Overal bed mobility: Needs Assistance Bed Mobility: Supine to Sit     Supine to sit: HOB elevated, Min assist     General bed mobility comments: min A to assist with raising trunk and scoot R hip to EOB.    Transfers Overall transfer level: Needs assistance Equipment used: Rolling walker (2 wheels) Transfers: Sit  to/from Stand Sit to Stand: Mod assist           General transfer comment: Cues needed for hand placement STS mod A from EOB. 2 unsuccessful attempts to stand initially, changed socks and performed premobiltiy exercises. STSx2 from recliner with min A, moderate cues needed throughout session to ensure pt kept heels back and had a good anterior lean to promote bottom clearance.     Balance Overall balance assessment: Needs assistance Sitting-balance support: No upper extremity supported   Sitting balance - Comments: supervision sitting statically EOB   Standing balance support: Bilateral upper extremity supported, During functional activity, Reliant on assistive device for balance Standing balance-Leahy Scale: Poor Standing balance comment: reliant on RW                           ADL either performed or assessed with clinical judgement   ADL                       Lower Body Dressing: Total assistance;Sitting/lateral leans   Toilet Transfer: Minimal assistance;Rolling walker (2 wheels)           Functional mobility during ADLs: Minimal assistance;Rolling walker (2 wheels)      Extremity/Trunk Assessment              Vision       Perception     Praxis     Communication Communication Communication: Impaired Factors Affecting Communication: Hearing impaired   Cognition Arousal: Alert Behavior During Therapy: Flat affect Cognition: Cognition impaired     Awareness: Intellectual awareness intact, Online awareness impaired   Attention impairment (select first level of impairment):  Sustained attention Executive functioning impairment (select all impairments): Problem solving OT - Cognition Comments: Pt able to identify what is making it harder for her to stand.                 Following commands: Impaired Following commands impaired: Follows one step commands with increased time      Cueing   Cueing Techniques: Verbal cues,  Tactile cues  Exercises General Exercises - Lower Extremity Long Arc Quad: AROM, Strengthening, Both, Seated, 5 reps Hip Flexion/Marching: AROM, Strengthening, Both, 10 reps, Standing, Seated (10 sitting and 10 standing.)    Shoulder Instructions       General Comments Pt reports being fatigued following mobility and exercises.    Pertinent Vitals/ Pain       Pain Assessment Pain Assessment: Faces Faces Pain Scale: No hurt Pain Intervention(s): Monitored during session  Home Living                                          Prior Functioning/Environment              Frequency  Min 2X/week        Progress Toward Goals  OT Goals(current goals can now be found in the care plan section)  Progress towards OT goals: Progressing toward goals  Acute Rehab OT Goals Patient Stated Goal: pt did not state, dtr wants her to get stronger so she can go back home OT Goal Formulation: With patient/family Time For Goal Achievement: 09/25/23 Potential to Achieve Goals: Good  Plan      Co-evaluation                 AM-PAC OT 6 Clicks Daily Activity     Outcome Measure   Help from another person eating meals?: A Little Help from another person taking care of personal grooming?: A Little Help from another person toileting, which includes using toliet, bedpan, or urinal?: A Lot Help from another person bathing (including washing, rinsing, drying)?: A Lot Help from another person to put on and taking off regular upper body clothing?: A Lot Help from another person to put on and taking off regular lower body clothing?: Total 6 Click Score: 13    End of Session Equipment Utilized During Treatment: Gait belt;Rolling walker (2 wheels)  OT Visit Diagnosis: Unsteadiness on feet (R26.81);Other abnormalities of gait and mobility (R26.89);Muscle weakness (generalized) (M62.81);Other symptoms and signs involving cognitive function   Activity Tolerance Patient  tolerated treatment well   Patient Left in chair;with call bell/phone within reach;with chair alarm set;with family/visitor present   Nurse Communication Mobility status        Time: 8841-8780 OT Time Calculation (min): 21 min  Charges: OT General Charges $OT Visit: 1 Visit OT Treatments $Therapeutic Activity: 8-22 mins  09/15/2023  AB, OTR/L  Acute Rehabilitation Services  Office: [phone]   Curtistine JONETTA Das 09/15/2023, 3:26 PM

## 2023-09-16 DIAGNOSIS — R531 Weakness: Secondary | ICD-10-CM | POA: Diagnosis not present

## 2023-09-16 DIAGNOSIS — N179 Acute kidney failure, unspecified: Secondary | ICD-10-CM | POA: Diagnosis not present

## 2023-09-16 DIAGNOSIS — E860 Dehydration: Secondary | ICD-10-CM | POA: Diagnosis not present

## 2023-09-16 DIAGNOSIS — E440 Moderate protein-calorie malnutrition: Secondary | ICD-10-CM | POA: Insufficient documentation

## 2023-09-16 DIAGNOSIS — R140 Abdominal distension (gaseous): Secondary | ICD-10-CM | POA: Diagnosis not present

## 2023-09-16 LAB — RENAL FUNCTION PANEL
Albumin: 2.8 g/dL — ABNORMAL LOW (ref 3.5–5.0)
Anion gap: 14 (ref 5–15)
BUN: 16 mg/dL (ref 8–23)
CO2: 20 mmol/L — ABNORMAL LOW (ref 22–32)
Calcium: 8.8 mg/dL — ABNORMAL LOW (ref 8.9–10.3)
Chloride: 109 mmol/L (ref 98–111)
Creatinine, Ser: 0.99 mg/dL (ref 0.44–1.00)
GFR, Estimated: 55 mL/min — ABNORMAL LOW (ref >60)
Glucose, Bld: 90 mg/dL (ref 70–99)
Phosphorus: 1.1 mg/dL — ABNORMAL LOW (ref 2.5–4.6)
Potassium: 4.5 mmol/L (ref 3.5–5.1)
Sodium: 143 mmol/L (ref 135–145)

## 2023-09-16 LAB — CK: Total CK: 76 U/L (ref 38–234)

## 2023-09-16 LAB — CBC WITH DIFFERENTIAL/PLATELET
Abs Immature Granulocytes: 0.59 K/uL — ABNORMAL HIGH (ref 0.00–0.07)
Basophils Absolute: 0 K/uL (ref 0.0–0.1)
Basophils Relative: 0 %
Eosinophils Absolute: 0.1 K/uL (ref 0.0–0.5)
Eosinophils Relative: 0 %
HCT: 32.3 % — ABNORMAL LOW (ref 36.0–46.0)
Hemoglobin: 10.7 g/dL — ABNORMAL LOW (ref 12.0–15.0)
Immature Granulocytes: 3 %
Lymphocytes Relative: 6 %
Lymphs Abs: 1.1 K/uL (ref 0.7–4.0)
MCH: 31.8 pg (ref 26.0–34.0)
MCHC: 33.1 g/dL (ref 30.0–36.0)
MCV: 95.8 fL (ref 80.0–100.0)
Monocytes Absolute: 1 K/uL (ref 0.1–1.0)
Monocytes Relative: 6 %
Neutro Abs: 14.6 K/uL — ABNORMAL HIGH (ref 1.7–7.7)
Neutrophils Relative %: 85 %
Platelets: 363 K/uL (ref 150–400)
RBC: 3.37 MIL/uL — ABNORMAL LOW (ref 3.87–5.11)
RDW: 14.3 % (ref 11.5–15.5)
WBC: 17.4 K/uL — ABNORMAL HIGH (ref 4.0–10.5)
nRBC: 0 % (ref 0.0–0.2)

## 2023-09-16 LAB — URINALYSIS, ROUTINE W REFLEX MICROSCOPIC
Bilirubin Urine: NEGATIVE
Glucose, UA: NEGATIVE mg/dL
Ketones, ur: NEGATIVE mg/dL
Nitrite: POSITIVE — AB
Protein, ur: 30 mg/dL — AB
Specific Gravity, Urine: 1.017 (ref 1.005–1.030)
WBC, UA: >50 WBC/hpf (ref 0–5)
pH: 5 (ref 5.0–8.0)

## 2023-09-16 LAB — CBC
HCT: 37.9 % (ref 36.0–46.0)
Hemoglobin: 12.1 g/dL (ref 12.0–15.0)
MCH: 30.9 pg (ref 26.0–34.0)
MCHC: 31.9 g/dL (ref 30.0–36.0)
MCV: 96.9 fL (ref 80.0–100.0)
Platelets: 335 K/uL (ref 150–400)
RBC: 3.91 MIL/uL (ref 3.87–5.11)
RDW: 14.5 % (ref 11.5–15.5)
WBC: 18.8 K/uL — ABNORMAL HIGH (ref 4.0–10.5)
nRBC: 0 % (ref 0.0–0.2)

## 2023-09-16 LAB — MAGNESIUM: Magnesium: 1.9 mg/dL (ref 1.7–2.4)

## 2023-09-16 MED ORDER — ADULT MULTIVITAMIN W/MINERALS CH
1.0000 | ORAL_TABLET | Freq: Every day | ORAL | Status: DC
  Administered 2023-09-17 – 2023-09-18 (×2): 1 via ORAL
  Filled 2023-09-16 (×3): qty 1

## 2023-09-16 MED ORDER — SODIUM PHOSPHATES 45 MMOLE/15ML IV SOLN
30.0000 mmol | Freq: Once | INTRAVENOUS | Status: AC
  Administered 2023-09-16: 30 mmol via INTRAVENOUS
  Filled 2023-09-16: qty 10

## 2023-09-16 MED ORDER — ENSURE PLUS HIGH PROTEIN PO LIQD
237.0000 mL | Freq: Three times a day (TID) | ORAL | Status: DC
  Administered 2023-09-16 – 2023-09-18 (×3): 237 mL via ORAL
  Filled 2023-09-16 (×2): qty 237

## 2023-09-16 MED ORDER — SODIUM CHLORIDE 0.9 % IV SOLN
2.0000 g | INTRAVENOUS | Status: DC
  Administered 2023-09-16 – 2023-09-17 (×2): 2 g via INTRAVENOUS
  Filled 2023-09-16 (×2): qty 20

## 2023-09-16 NOTE — NC FL2 (Signed)
 [facility]  MEDICAID FL2 LEVEL OF CARE FORM     IDENTIFICATION  Patient Name: Megan Harmon Birthdate: October 02, 1932 Sex: female Admission Date (Current Location): 09/10/2023  [facility] and [place] Number:  Producer, television/film/video and Address:  [facility], [address] [place], [place] [address]      Provider Number: [id number]  Attending Physician Name and Address:  Kathrin Mignon DASEN, MD  Relative Name and Phone Number:  Clorine Swing.; Son; [phone]    Current Level of Care: Hospital Recommended Level of Care: Skilled Nursing Facility Prior Approval Number:    Date Approved/Denied:   PASRR Number: [id number] E valid 09/14/23-10/14/23.  Discharge Plan: SNF    Current Diagnoses: Patient Active Problem List   Diagnosis Date Noted   Malnutrition of moderate degree 09/16/2023   Essential hypertension 09/10/2023   Gastroesophageal reflux disease 09/10/2023   Glaucoma 09/10/2023   Hypothyroidism 09/10/2023   Mixed hyperlipidemia 09/10/2023   Obesity 09/10/2023   Osteoarthritis 09/10/2023   Senile osteopenia 09/10/2023   AKI (acute kidney injury) (HCC) 09/10/2023   Generalized weakness 09/10/2023   Failure to thrive in adult 09/10/2023   Dehydration 09/10/2023   Abdominal distention 09/10/2023   Normocytic anemia 09/10/2023   Pressure injury of skin 09/10/2023    Orientation RESPIRATION BLADDER Height & Weight     Self, Place, Situation  Normal (Room Air) Incontinent, External catheter Weight: 210 lb (95.3 kg) Height:  5' 6 (167.6 cm)  BEHAVIORAL SYMPTOMS/MOOD NEUROLOGICAL BOWEL NUTRITION STATUS      Incontinent Diet (Please see discharge summary)  AMBULATORY STATUS COMMUNICATION OF NEEDS Skin   Limited Assist Verbally PU Stage and Appropriate Care (Wound 09/10/23 1446 Pressure Injury Buttocks Left Stage 2 - Partial thickness loss of dermis presenting as a shallow open injury with a red, pink wound bed without slough.)                        Personal Care Assistance Level of Assistance  Bathing, Feeding, Dressing Bathing Assistance: Limited assistance Feeding assistance: Limited assistance Dressing Assistance: Limited assistance     Functional Limitations Info  Sight, Hearing Sight Info: Impaired (R and L (Eyeglasses)) Hearing Info: Impaired (R and L) Speech Info: Adequate    SPECIAL CARE FACTORS FREQUENCY  PT (By licensed PT), OT (By licensed OT)     PT Frequency: 5x OT Frequency: 5x            Contractures Contractures Info: Not present    Additional Factors Info  Code Status, Allergies Code Status Info: DNR-LIMITED -Do Not Intubate/DNI Allergies Info: NKA     Isolation Precautions Info: Enteric Precautions     Current Medications (09/16/2023):  This is the current hospital active medication list Current Facility-Administered Medications  Medication Dose Route Frequency Provider Last Rate Last Admin   acetaminophen  (TYLENOL ) tablet 1,000 mg  1,000 mg Oral TID Amponsah, Prosper M, MD   1,000 mg at 09/16/23 1001   ALPRAZolam  (XANAX ) tablet 0.25-0.5 mg  0.25-0.5 mg Oral TID PRN Amponsah, Prosper M, MD   0.5 mg at 09/16/23 1002   benazepril  (LOTENSIN ) tablet 20 mg  20 mg Oral Daily Gonfa, Taye T, MD   20 mg at 09/16/23 0956   bisacodyl  (DULCOLAX) EC tablet 5 mg  5 mg Oral Daily PRN Amponsah, Prosper M, MD       citalopram  (CELEXA ) tablet 40 mg  40 mg Oral Daily Amponsah, Prosper M, MD   40 mg  at 09/16/23 0956   feeding supplement (ENSURE PLUS HIGH PROTEIN) liquid 237 mL  237 mL Oral TID BM Gonfa, Taye T, MD       heparin  injection 5,000 Units  5,000 Units Subcutaneous Q8H Amponsah, Prosper M, MD   5,000 Units at 09/16/23 1437   hydrALAZINE  (APRESOLINE ) injection 10 mg  10 mg Intravenous Q8H PRN Amponsah, Prosper M, MD       levothyroxine  (SYNTHROID ) tablet 150 mcg  150 mcg Oral Q0600 Lou Claretta HERO, MD   150 mcg at 09/16/23 0535   loperamide  (IMODIUM ) capsule 2 mg  2 mg Oral PRN Gherghe, Costin  M, MD   2 mg at 09/14/23 2125   mirtazapine  (REMERON ) tablet 7.5 mg  7.5 mg Oral QHS Gherghe, Costin M, MD   7.5 mg at 09/15/23 2148   multivitamin with minerals tablet 1 tablet  1 tablet Oral Daily Gonfa, Taye T, MD       ondansetron  (ZOFRAN ) tablet 4 mg  4 mg Oral Q6H PRN Amponsah, Prosper M, MD       Or   ondansetron  (ZOFRAN ) injection 4 mg  4 mg Intravenous Q6H PRN Amponsah, Prosper M, MD       oxyCODONE -acetaminophen  (PERCOCET/ROXICET) 5-325 MG per tablet 1 tablet  1 tablet Oral Q12H PRN Kathrin Mignon DASEN, MD   1 tablet at 09/16/23 1002   senna-docusate (Senokot-S) tablet 1 tablet  1 tablet Oral QHS Lou Claretta HERO, MD       simvastatin  (ZOCOR ) tablet 20 mg  20 mg Oral QPM Lou Claretta HERO, MD   20 mg at 09/15/23 1840   sodium phosphate  30 mmol in sodium chloride  0.9 % 250 mL infusion  30 mmol Intravenous Once Gonfa, Taye T, MD 43 mL/hr at 09/16/23 1437 30 mmol at 09/16/23 1437     Discharge Medications: Please see discharge summary for a list of discharge medications.  Relevant Imaging Results:  Relevant Lab Results:   Additional Information SSN [id number]  Lauraine FORBES Saa, LCSWA

## 2023-09-16 NOTE — TOC Progression Note (Addendum)
 Transition of Care [facility]) - Progression Note    Patient Details  Name: Megan Harmon MRN: [id number] Date of Birth: 11-01-1932  [facility] CM/SW Contact  Lauraine FORBES Saa, LCSWA Phone Number: 09/16/2023, 11:56 AM  Clinical Narrative:     11:56 AM Per MD, patient is not medically ready for discharge to [facility] as patient's WBC elevated. MD stated that patient feels hot and cold and that he is repeating CBC to confirm before identifying possible infection. SNF and bedside RN made aware. CSW will continue to follow and be available to assist.  1:54 PM HTA representative confirmed with CSW that patient's SNF insurance authorization expires tomorrow at 17:00. CSW made medical team aware. Patient will need new SNF insurance authorization if current SNF insurance authorization expires prior to patient discharge. CSW will continue to follow and be available to assist.  4:01 PM CSW sent patient's updated FL2 to [facility].  Expected Discharge Plan: Skilled Nursing Facility Barriers to Discharge: Continued Medical Work up               Expected Discharge Plan and Services In-house Referral: Clinical Social Work   Post Acute Care Choice: Skilled Nursing Facility Living arrangements for the past 2 months: Single Family Home                                       Social Drivers of Health (SDOH) Interventions SDOH Screenings   Food Insecurity: No Food Insecurity (09/10/2023)  Housing: Low Risk  (09/10/2023)  Transportation Needs: No Transportation Needs (09/10/2023)  Utilities: Not At Risk (09/10/2023)  Social Connections: Patient Unable To Answer (09/10/2023)  Tobacco Use: Low Risk  (09/10/2023)    Readmission Risk Interventions     No data to display

## 2023-09-16 NOTE — Plan of Care (Signed)

## 2023-09-16 NOTE — Progress Notes (Signed)
 PROGRESS NOTE  Megan Harmon FMW:[id number] DOB: 04-01-1932   PCP: Loreli Kins, MD  Patient is from: Home.  Lives alone.  Uses rolling walker  DOA: 09/10/2023 LOS: 6  Chief complaints Chief Complaint  Patient presents with   Weakness     Brief Narrative / Interim history: 89 year old F with PMH of HTN, anxiety, depression, osteoarthritis and hypothyroidism presented to ED with generalized weakness, abdominal distention, watery stool and decreased appetite.  Apparently, patient lost her spouse about 4 months ago and has had a gradual decline in her health. Patient currently lives alone and ambulates with a walker with family close by and monitoring closely via camera. Over the last 2 days, they have noticed patient has had significantly decrease in her mobility. Around 3 AM this morning, they noticed that patient was sitting on something and unable to get up. Son reports that when he checked on patient, she had been sitting on the bottom of her lift chair for hours and had significant difficulty getting up or moving her feet.  Patient was admitted for AKI, dehydration, hypokalemia, acute metabolic encephalopathy and generalized weakness.  AKI, dehydration and encephalopathy resolved with IV fluid hydration and adjustment of home medications.  Now rising leukocytosis.  No clear source of infection.  Patient had Foley catheter since admission that was removed on 9/3.  Therapy recommended SNF.  Subjective: Seen and examined earlier this morning.  No major events overnight or this morning.  Daughter at bedside and reports poor appetite and pain but patient denies pain stating that he has shoulder and back hurts from lying in bed.  Denies chest pain, shortness of breath, cough, nausea, vomiting, dysuria, frequency or urgency is awake and alert and oriented x 4 except time.  Objective: Vitals:   09/15/23 1942 09/16/23 0412 09/16/23 0951 09/16/23 1307  BP: (!) 137/50 133/61 (!) 161/60  (!) 113/43  Pulse: 70 76 82 97  Resp: 18 19    Temp: 98.1 F (36.7 C) 98.2 F (36.8 C) 98.5 F (36.9 C) 97.8 F (36.6 C)  TempSrc:   Oral Oral  SpO2: 95% 99% 100% 97%  Weight:      Height:        Examination:  GENERAL: No apparent distress.  Nontoxic. HEENT: MMM.  Vision and hearing grossly intact.  NECK: Supple.  No apparent JVD.  RESP:  No IWOB.  Fair aeration bilaterally. CVS:  RRR. Heart sounds normal.  ABD/GI/GU: BS+. Abd soft, NTND.  Foley catheter in place. MSK/EXT:  Moves extremities. No apparent deformity. No edema.  SKIN: no apparent skin lesion or wound NEURO: AA.  Oriented x 4 except time.  No apparent focal neuro deficit. PSYCH: Calm. Normal affect.   Consultants:  None  Procedures: None  Microbiology summarized: C. difficile and GIP negative  Assessment and plan: Acute kidney injury/dehydration-in the setting of poor p.o. intake, diarrhea and concurrent use of diuretics and ACE inhibitors.  Renal ultrasound negative for hydronephrosis.  AKI resolved. -Encourage oral hydration -Discontinue diuretics indefinitely   Generalized weakness: Daughter concerned about patient's poor appetite. - Decrease narcotics - OOB/PT/OT  Acute metabolic encephalopathy: No formal diagnosis of dementia.  She is currently awake and alert oriented. - Minimize sedating medication - Reorientation and delirium precautions  Diarrheal illness-CT scan without significant acute findings other than evidence of diarrheal illness.  C. difficile and GIP negative.  No fever or leukocytosis.  Abdominal exam benign.  Overflow diarrhea?  Resolved.  Hypernatremia-suspect due to poor p.o. intake.  Resolved.   Essential hypertension-BP within acceptable range. -Continue home benazepril  at reduced dose. -Continue holding diuretics   Hypokalemia/hypophosphatemia -Monitor replenish K, P and Mg as appropriate   Hyperlipidemia -continue statin   Hypothyroidism-TSH normal - Continue  Synthroid    Depression/grief/decreased appetite: episode of depression after losing her husband? -Continue citalopram  -Low-dose Remeron  added here   Iron deficiency anemia- -continue supplementation   Mild rhabdomyolysis-CK 429.    Acute urinary retention?  Passed voiding trial on 9/3. - Continue bladder scan, strict intake and output. - OOB, PT/OT   Chronic back pain: Fairly controlled. - Continue scheduled Tylenol   - Decrease as needed oxycodone .  Leukocytosis/bandemia: WBC trended up further today.  No clear source of infection.  She catheter that was removed on 9/3. Currently no GI, respiratory or UTI symptoms.  Small skin wound on right thigh but no signs of infection - Check urinalysis  Class I obesity/poor appetite Body mass index is 33.89 kg/m. Nutrition Problem: Moderate Malnutrition Etiology: chronic illness Signs/Symptoms: mild fat depletion, moderate muscle depletion Interventions: Ensure Enlive (each supplement provides 350kcal and 20 grams of protein), Magic cup, MVI, Refer to RD note for recommendations   DVT prophylaxis:  heparin  injection 5,000 Units Start: 09/10/23 1415  Code Status: DNR-Limited Family Communication: Updated patient's daughter at bedside Level of care: Med-Surg Status is: Inpatient Remains inpatient appropriate because: Poor appetite, leukocytosis/bandemia   Final disposition: SNF   55 minutes with more than 50% spent in reviewing records, counseling patient/family and coordinating care.   Sch Meds:  Scheduled Meds:  acetaminophen   1,000 mg Oral TID   benazepril   20 mg Oral Daily   citalopram   40 mg Oral Daily   feeding supplement  237 mL Oral TID BM   heparin   5,000 Units Subcutaneous Q8H   levothyroxine   150 mcg Oral Q0600   mirtazapine   7.5 mg Oral QHS   multivitamin with minerals  1 tablet Oral Daily   senna-docusate  1 tablet Oral QHS   simvastatin   20 mg Oral QPM   Continuous Infusions:  sodium PHOSPHATE  IVPB (in  mmol)     PRN Meds:.ALPRAZolam , bisacodyl , hydrALAZINE , loperamide , ondansetron  **OR** ondansetron  (ZOFRAN ) IV, oxyCODONE -acetaminophen  **AND** [DISCONTINUED] oxyCODONE   Antimicrobials: Anti-infectives (From admission, onward)    None        I have personally reviewed the following labs and images: CBC: Recent Labs  Lab 09/11/23 1210 09/13/23 0440 09/15/23 0505 09/16/23 0741 09/16/23 1026  WBC 9.5 11.6* 12.7* 18.8* 17.4*  NEUTROABS  --   --   --   --  14.6*  HGB 10.7* 10.8* 10.5* 12.1 10.7*  HCT 32.3* 32.7* 31.6* 37.9 32.3*  MCV 94.4 94.8 94.3 96.9 95.8  PLT 419* 428* 365 335 363   BMP &GFR Recent Labs  Lab 09/10/23 1601 09/11/23 1210 09/12/23 0835 09/13/23 0440 09/14/23 0810 09/15/23 0505 09/16/23 0741  NA  --    < > 139 147* 142 139 143  K  --    < > 2.8* 3.0* 2.9* 3.4* 4.5  CL  --    < > 108 111 109 110 109  CO2  --    < > 19* 21* 22 21* 20*  GLUCOSE  --    < > 84 121* 102* 84 90  BUN  --    < > 42* 31* 20 19 16   CREATININE  --    < > 1.28* 1.18* 1.07* 0.94 0.99  CALCIUM  --    < > 8.5* 8.9 8.6*  8.4* 8.8*  MG 1.9  --   --  2.0 1.7 2.1 1.9  PHOS 3.1  --   --   --   --   --  1.1*   < > = values in this interval not displayed.   Estimated Creatinine Clearance: 44.8 mL/min (by C-G formula based on SCr of 0.99 mg/dL). Liver & Pancreas: Recent Labs  Lab 09/10/23 1041 09/15/23 0505 09/16/23 0741  AST 34 27  --   ALT 22 30  --   ALKPHOS 96 63  --   BILITOT 0.6 0.4  --   PROT 6.4* 5.2*  --   ALBUMIN 2.9* 2.4* 2.8*   No results for input(s): LIPASE, AMYLASE in the last 168 hours. No results for input(s): AMMONIA in the last 168 hours. Diabetic: No results for input(s): HGBA1C in the last 72 hours. Recent Labs  Lab 09/10/23 1042  GLUCAP 101*   Cardiac Enzymes: Recent Labs  Lab 09/10/23 1601 09/16/23 0741  CKTOTAL 429* 76   No results for input(s): PROBNP in the last 8760 hours. Coagulation Profile: No results for input(s): INR,  PROTIME in the last 168 hours. Thyroid  Function Tests: No results for input(s): TSH, T4TOTAL, FREET4, T3FREE, THYROIDAB in the last 72 hours. Lipid Profile: No results for input(s): CHOL, HDL, LDLCALC, TRIG, CHOLHDL, LDLDIRECT in the last 72 hours. Anemia Panel: No results for input(s): VITAMINB12, FOLATE, FERRITIN, TIBC, IRON, RETICCTPCT in the last 72 hours. Urine analysis:    Component Value Date/Time   COLORURINE YELLOW 09/10/2023 1054   APPEARANCEUR CLEAR 09/10/2023 1054   LABSPEC 1.015 09/10/2023 1054   PHURINE 5.0 09/10/2023 1054   GLUCOSEU NEGATIVE 09/10/2023 1054   HGBUR NEGATIVE 09/10/2023 1054   BILIRUBINUR NEGATIVE 09/10/2023 1054   KETONESUR NEGATIVE 09/10/2023 1054   PROTEINUR NEGATIVE 09/10/2023 1054   NITRITE NEGATIVE 09/10/2023 1054   LEUKOCYTESUR NEGATIVE 09/10/2023 1054   Sepsis Labs: Invalid input(s): PROCALCITONIN, LACTICIDVEN  Microbiology: Recent Results (from the past 240 hours)  Gastrointestinal Panel by PCR , Stool     Status: None   Collection Time: 09/11/23  6:55 AM   Specimen: STOOL  Result Value Ref Range Status   Campylobacter species NOT DETECTED NOT DETECTED Final   Plesimonas shigelloides NOT DETECTED NOT DETECTED Final   Salmonella species NOT DETECTED NOT DETECTED Final   Yersinia enterocolitica NOT DETECTED NOT DETECTED Final   Vibrio species NOT DETECTED NOT DETECTED Final   Vibrio cholerae NOT DETECTED NOT DETECTED Final   Enteroaggregative E coli (EAEC) NOT DETECTED NOT DETECTED Final   Enteropathogenic E coli (EPEC) NOT DETECTED NOT DETECTED Final   Enterotoxigenic E coli (ETEC) NOT DETECTED NOT DETECTED Final   Shiga like toxin producing E coli (STEC) NOT DETECTED NOT DETECTED Final   Shigella/Enteroinvasive E coli (EIEC) NOT DETECTED NOT DETECTED Final   Cryptosporidium NOT DETECTED NOT DETECTED Final   Cyclospora cayetanensis NOT DETECTED NOT DETECTED Final   Entamoeba histolytica NOT  DETECTED NOT DETECTED Final   Giardia lamblia NOT DETECTED NOT DETECTED Final   Adenovirus F40/41 NOT DETECTED NOT DETECTED Final   Astrovirus NOT DETECTED NOT DETECTED Final   Norovirus GI/GII NOT DETECTED NOT DETECTED Final   Rotavirus A NOT DETECTED NOT DETECTED Final   Sapovirus (I, II, IV, and V) NOT DETECTED NOT DETECTED Final    Comment: Performed at [facility], [address], [place], [place] [address]  C Difficile Quick Screen w PCR reflex     Status: None  Collection Time: 09/11/23  6:55 AM   Specimen: STOOL  Result Value Ref Range Status   C Diff antigen NEGATIVE NEGATIVE Final   C Diff toxin NEGATIVE NEGATIVE Final   C Diff interpretation No C. difficile detected.  Final    Comment: Performed at [facility], [address] [place], [place] [address]    Radiology Studies: No results found.    Miliano Cotten T. Emanuele Mcwhirter [facility]  If 7PM-7AM, please contact night-coverage [email or web address] 09/16/2023, 1:53 PM

## 2023-09-16 NOTE — Progress Notes (Signed)
 Mobility Specialist Progress Note:    09/16/23 0950  Mobility  Activity Pivoted/transferred from chair to bed  Level of Assistance Minimal assist, patient does 75% or more  Assistive Device Front wheel walker  Distance Ambulated (ft) 5 ft  Activity Response Tolerated well  Mobility Referral Yes  Mobility visit 1 Mobility  Mobility Specialist Start Time (ACUTE ONLY) 0845  Mobility Specialist Stop Time (ACUTE ONLY) 0858  Mobility Specialist Time Calculation (min) (ACUTE ONLY) 13 min   Received pt in chair and agreeable to mobility. Pt requested to get to bed. Required MinA STS, otherwise contact guard. No c/o. Returned to bed with alarm on. Personal belongings and call light within reach. All needs met.  Lavanda Pollack Mobility Specialist  Please contact via [employer] or  Rehab Office [phone]

## 2023-09-16 NOTE — Plan of Care (Signed)
 UA positive for UTI.  Called and requested micro to add urine culture to previous sample.  Starting IV ceftriaxone .  No prior history of MDR

## 2023-09-16 NOTE — Plan of Care (Signed)
  Problem: Education: Goal: Knowledge of General Education information will improve Description Including pain rating scale, medication(s)/side effects and non-pharmacologic comfort measures Outcome: Progressing   Problem: Clinical Measurements: Goal: Ability to maintain clinical measurements within normal limits will improve Outcome: Progressing Goal: Diagnostic test results will improve Outcome: Progressing Goal: Respiratory complications will improve Outcome: Progressing   Problem: Nutrition: Goal: Adequate nutrition will be maintained Outcome: Progressing   Problem: Elimination: Goal: Will not experience complications related to bowel motility Outcome: Progressing

## 2023-09-16 NOTE — Progress Notes (Signed)
 Initial Nutrition Assessment  DOCUMENTATION CODES:   Non-severe (moderate) malnutrition in context of chronic illness  INTERVENTION:   - Increase Ensure Plus High Protein po to TID, each supplement provides 350 kcal and 20 grams of protein  - Magic Cup BID with lunch and dinner meals, each supplement provides 290 kcal and 9 grams of protein  - MVI with minerals daily  - Agree with Regular diet order; given pt is not ordering meal trays and is not answering her phone with [employer] [facility] calls, will change pt to automatic meal trays  - Encourage PO intake  NUTRITION DIAGNOSIS:   Moderate Malnutrition related to chronic illness as evidenced by mild fat depletion, moderate muscle depletion.  GOAL:   Patient will meet greater than or equal to 90% of their needs  MONITOR:   PO intake, Supplement acceptance, Labs, Weight trends, Skin  REASON FOR ASSESSMENT:   Consult Assessment of nutrition requirement/status, Poor PO  ASSESSMENT:   89 year old female who presented to the ED on 09/10/23 with generalized weakness, abdominal distention, watery stool, and decreased appetite. PMH of anxiety, depression, osteoarthritis, HTN, hypothyroidism. Pt admitted with AKI, dehydration, hypokalemia, and acute metabolic encephalopathy.  8/29 - dysphagia 3 diet, later changed to NPO 8/30 - NPO, later advanced to clear liquid diet 9/01 - Regular diet  Reviewed notes in chart; noted pt's husband passed away about 4 months ago and that pt has had poor PO intake since then.  Spoke with pt briefly at bedside. Pt with an Ensure Plus High Protein supplement and had almost finished the shake. Pt reports that she likes the Ensure and is willing to continue drinking it. Discussed importance of supplement intake since appetite is poor. Will increase order for Ensure Plus High Protein from BID to TID. Pt states that she also likes ice cream; will add Magic Cups to lunch and dinner meal trays. Agree  with Regular diet order.  Pt states that she has eaten today but was unable to name what meal she had or what she consumed. Pt endorses a poor appetite at home. She states that she only eats dinner but was unable to name items she typically consumes.  Pt unsure of UBW or whether or not she has lost weight. Reviewed weight history in chart. Pt with a 6.8 kg or 6.7% weight loss from 11/03/22 to 09/10/23. This is not clinically significant for timeframe but is concerning given poor PO intake. Pt does meet criteria for moderate malnutrition.  Noted low phosphorus today. Unsure whether this may be related to refeeding syndrome or something else. MD has ordered IV replacement. RD to add daily MVI with minerals.  Meal Completion: 0-25%  Medications reviewed and include: Ensure Plus High Protein BID, levothyroxine , remeron  7.5 mg daily, senna, IV sodium phosphate  30 mmol x 1  Micronutrient Labs: Vitamin D : 36.25 (WNL) Vitamin B12: 1137 (H) Folate: 15.7 (WNL)  Labs reviewed: phosphorus 1.1  NUTRITION - FOCUSED PHYSICAL EXAM:  Flowsheet Row Most Recent Value  Orbital Region Mild depletion  Upper Arm Region No depletion  Thoracic and Lumbar Region No depletion  Buccal Region Mild depletion  Temple Region Moderate depletion  Clavicle Bone Region Mild depletion  Clavicle and Acromion Bone Region Moderate depletion  Scapular Bone Region Unable to assess  Dorsal Hand Moderate depletion  Patellar Region Mild depletion  Anterior Thigh Region Mild depletion  Posterior Calf Region Mild depletion  Edema (RD Assessment) Mild  Hair Reviewed  Eyes Reviewed  Mouth Reviewed  Skin  Reviewed  Nails Reviewed    Diet Order:   Diet Order             Diet regular Room service appropriate? No; Fluid consistency: Thin  Diet effective now                   EDUCATION NEEDS:   Not appropriate for education at this time  Skin:  Skin Assessment: Skin Integrity Issues: Stage II: left  buttocks  Last BM:  09/14/23 large type 7  Height:   Ht Readings from Last 1 Encounters:  09/10/23 5' 6 (1.676 m)    Weight:   Wt Readings from Last 1 Encounters:  09/10/23 95.3 kg    BMI:  Body mass index is 33.89 kg/m.  Estimated Nutritional Needs:   Kcal:  1700-1900  Protein:  85-100 grams  Fluid:  1.7-1.9 L    Mallie Satchel, MS, RD, LDN Registered Dietitian II Please see AMiON for contact information.

## 2023-09-17 DIAGNOSIS — R531 Weakness: Secondary | ICD-10-CM | POA: Diagnosis not present

## 2023-09-17 DIAGNOSIS — N179 Acute kidney failure, unspecified: Secondary | ICD-10-CM | POA: Diagnosis not present

## 2023-09-17 DIAGNOSIS — R140 Abdominal distension (gaseous): Secondary | ICD-10-CM | POA: Diagnosis not present

## 2023-09-17 DIAGNOSIS — E860 Dehydration: Secondary | ICD-10-CM | POA: Diagnosis not present

## 2023-09-17 LAB — RENAL FUNCTION PANEL
Albumin: 2.1 g/dL — ABNORMAL LOW (ref 3.5–5.0)
Anion gap: 6 (ref 5–15)
BUN: 15 mg/dL (ref 8–23)
CO2: 22 mmol/L (ref 22–32)
Calcium: 8 mg/dL — ABNORMAL LOW (ref 8.9–10.3)
Chloride: 113 mmol/L — ABNORMAL HIGH (ref 98–111)
Creatinine, Ser: 0.93 mg/dL (ref 0.44–1.00)
GFR, Estimated: 59 mL/min — ABNORMAL LOW (ref >60)
Glucose, Bld: 112 mg/dL — ABNORMAL HIGH (ref 70–99)
Phosphorus: 2.5 mg/dL (ref 2.5–4.6)
Potassium: 3.7 mmol/L (ref 3.5–5.1)
Sodium: 141 mmol/L (ref 135–145)

## 2023-09-17 LAB — CBC
HCT: 29.8 % — ABNORMAL LOW (ref 36.0–46.0)
Hemoglobin: 9.8 g/dL — ABNORMAL LOW (ref 12.0–15.0)
MCH: 31.1 pg (ref 26.0–34.0)
MCHC: 32.9 g/dL (ref 30.0–36.0)
MCV: 94.6 fL (ref 80.0–100.0)
Platelets: 299 K/uL (ref 150–400)
RBC: 3.15 MIL/uL — ABNORMAL LOW (ref 3.87–5.11)
RDW: 14.5 % (ref 11.5–15.5)
WBC: 20.3 K/uL — ABNORMAL HIGH (ref 4.0–10.5)
nRBC: 0 % (ref 0.0–0.2)

## 2023-09-17 LAB — MAGNESIUM: Magnesium: 1.7 mg/dL (ref 1.7–2.4)

## 2023-09-17 NOTE — Progress Notes (Signed)
 Mobility Specialist Progress Note:    09/17/23 1100  Mobility  Activity  (Bed mobility)  Level of Assistance Modified independent, requires aide device or extra time  Range of Motion/Exercises Active;Right leg;Left leg  Activity Response Tolerated well  Mobility Referral Yes  Mobility visit 1 Mobility  Mobility Specialist Start Time (ACUTE ONLY) 1101  Mobility Specialist Stop Time (ACUTE ONLY) 1111  Mobility Specialist Time Calculation (min) (ACUTE ONLY) 10 min   Received pt in bed and agreeable to bed mobility. Further mobility deferred d/t pt fatigue. Pt completed x10 BLE leg lifts and x10 BLE ankle pumps. Pt returned supine in bed with alarm on. Personal belongings and call light within reach. All needs met.  Lavanda Pollack Mobility Specialist  Please contact via [employer] or  Rehab Office [phone]

## 2023-09-17 NOTE — Progress Notes (Addendum)
 PROGRESS NOTE  Megan Harmon FMW:[id number] DOB: 1932-04-18   PCP: Loreli Kins, MD  Patient is from: Home.  Lives alone.  Uses rolling walker  DOA: 09/10/2023 LOS: 7  Chief complaints Chief Complaint  Patient presents with   Weakness     Brief Narrative / Interim history: 89 year old F with PMH of HTN, anxiety, depression, osteoarthritis and hypothyroidism presented to ED with generalized weakness, abdominal distention, watery stool and decreased appetite.  Apparently, patient lost her spouse about 4 months ago and has had a gradual decline in her health. Patient currently lives alone and ambulates with a walker with family close by and monitoring closely via camera. Over the last 2 days, they have noticed patient has had significantly decrease in her mobility. Around 3 AM this morning, they noticed that patient was sitting on something and unable to get up. Son reports that when he checked on patient, she had been sitting on the bottom of her lift chair for hours and had significant difficulty getting up or moving her feet.  Patient was admitted for AKI, dehydration, hypokalemia, acute metabolic encephalopathy and generalized weakness.  AKI, dehydration and encephalopathy resolved with IV fluid hydration and adjustment of home medications.  However, she developed leukocytosis.  UA consistent with UTI.  Started on IV ceftriaxone .  Patient had Foley catheter since admission that was removed on 9/3.  Therapy recommended SNF.  Subjective: Seen and examined earlier this morning.  No major events overnight or this morning.  Reports feeling better.  No specific complaints.  She states she has had some burning with urination.  No other complaints.  Patient's son at bedside.  Objective: Vitals:   09/16/23 1657 09/16/23 2140 09/17/23 0539 09/17/23 0745  BP: (!) 145/60 (!) 155/71 (!) 153/69 (!) 143/75  Pulse: 79 89 72 70  Resp:  18 18   Temp: 98.8 F (37.1 C) 98.4 F (36.9 C) 98 F  (36.7 C) 98 F (36.7 C)  TempSrc:  Oral Oral   SpO2: 100% 99% 99% 94%  Weight:      Height:        Examination:  GENERAL: No apparent distress.  Nontoxic. HEENT: MMM.  Vision and hearing grossly intact.  NECK: Supple.  No apparent JVD.  RESP:  No IWOB.  Fair aeration bilaterally. CVS:  RRR. Heart sounds normal.  ABD/GI/GU: BS+. Abd soft, NTND.  Foley catheter in place. MSK/EXT:  Moves extremities. No apparent deformity. No edema.  SKIN: no apparent skin lesion or wound NEURO: AA.  Oriented x 4 except time.  No apparent focal neuro deficit. PSYCH: Calm. Normal affect.   Consultants:  None  Procedures: None  Microbiology summarized: C. difficile and GIP negative  Assessment and plan: Acute kidney injury/dehydration-in the setting of poor p.o. intake, diarrhea and concurrent use of diuretics and ACE inhibitors.  Renal ultrasound negative for hydronephrosis.  AKI resolved. -Encourage oral hydration -Discontinue diuretics indefinitely   Generalized weakness: Daughter concerned about patient's poor appetite. - Decrease narcotics - OOB/PT/OT  Acute metabolic encephalopathy: No formal diagnosis of dementia.  She is currently awake and alert oriented. - Minimize sedating medication - Reorientation and delirium precautions  Diarrheal illness-CT scan without significant acute findings other than evidence of diarrheal illness.  C. difficile and GIP negative.  No fever or leukocytosis.  Abdominal exam benign.  Overflow diarrhea?  Resolved.  Hypernatremia-suspect due to poor p.o. intake.  Resolved.   Essential hypertension-BP within acceptable range. -Continue home benazepril  at reduced dose. -Continue holding diuretics  Hypokalemia/hypophosphatemia -Monitor replenish K, P and Mg as appropriate   Hyperlipidemia -continue statin   Hypothyroidism-TSH normal - Continue Synthroid    Depression/grief/decreased appetite: episode of depression after losing her  husband? -Continue citalopram  -Low-dose Remeron  added here   Iron deficiency anemia- -continue supplementation   Mild rhabdomyolysis-CK 429.    Acute urinary retention?  Passed voiding trial on 9/3. - Continue bladder scan, strict intake and output. - OOB, PT/OT   Chronic back pain: Fairly controlled. - Continue scheduled Tylenol   - Decrease as needed oxycodone .  UTI due to Foley catheter: Not present on admission.  Had rising leukocytosis.  No objective fever but sense of feeling hot and cold as well.  Initially denied dysuria but she later admits.  She had a Foley catheter that was removed on 9/3.  UA consistent with UTI on 9/4. - Continue IV ceftriaxone  pending urine culture  Leukocytosis/bandemia: Likely due to UTI. - IV ceftriaxone  as above - Continue monitoring  Class I obesity/poor appetite Body mass index is 33.89 kg/m. Nutrition Problem: Moderate Malnutrition Etiology: chronic illness Signs/Symptoms: mild fat depletion, moderate muscle depletion Interventions: Ensure Enlive (each supplement provides 350kcal and 20 grams of protein), Magic cup, MVI, Refer to RD note for recommendations   Pressure injury to left buttocks stage 2, present on admission  - Appreciate help by wound care       DVT prophylaxis:  heparin  injection 5,000 Units Start: 09/10/23 1415  Code Status: DNR-Limited Family Communication: Updated patient's son at bedside. Level of care: Med-Surg Status is: Inpatient Remains inpatient appropriate because: Poor appetite, leukocytosis/bandemia   Final disposition: SNF   55 minutes with more than 50% spent in reviewing records, counseling patient/family and coordinating care.   Sch Meds:  Scheduled Meds:  acetaminophen   1,000 mg Oral TID   benazepril   20 mg Oral Daily   citalopram   40 mg Oral Daily   feeding supplement  237 mL Oral TID BM   heparin   5,000 Units Subcutaneous Q8H   levothyroxine   150 mcg Oral Q0600   mirtazapine   7.5 mg  Oral QHS   multivitamin with minerals  1 tablet Oral Daily   senna-docusate  1 tablet Oral QHS   simvastatin   20 mg Oral QPM   Continuous Infusions:  cefTRIAXone  (ROCEPHIN )  IV 2 g (09/16/23 1646)   PRN Meds:.ALPRAZolam , bisacodyl , hydrALAZINE , loperamide , ondansetron  **OR** ondansetron  (ZOFRAN ) IV, oxyCODONE -acetaminophen  **AND** [DISCONTINUED] oxyCODONE   Antimicrobials: Anti-infectives (From admission, onward)    Start     Dose/Rate Route Frequency Ordered Stop   09/16/23 1715  cefTRIAXone  (ROCEPHIN ) 2 g in sodium chloride  0.9 % 100 mL IVPB        2 g 200 mL/hr over 30 Minutes Intravenous Every 24 hours 09/16/23 1624          I have personally reviewed the following labs and images: CBC: Recent Labs  Lab 09/13/23 0440 09/15/23 0505 09/16/23 0741 09/16/23 1026 09/17/23 0447  WBC 11.6* 12.7* 18.8* 17.4* 20.3*  NEUTROABS  --   --   --  14.6*  --   HGB 10.8* 10.5* 12.1 10.7* 9.8*  HCT 32.7* 31.6* 37.9 32.3* 29.8*  MCV 94.8 94.3 96.9 95.8 94.6  PLT 428* 365 335 363 299   BMP &GFR Recent Labs  Lab 09/10/23 1601 09/11/23 1210 09/13/23 0440 09/14/23 0810 09/15/23 0505 09/16/23 0741 09/17/23 0447  NA  --    < > 147* 142 139 143 141  K  --    < > 3.0* 2.9* 3.4*  4.5 3.7  CL  --    < > 111 109 110 109 113*  CO2  --    < > 21* 22 21* 20* 22  GLUCOSE  --    < > 121* 102* 84 90 112*  BUN  --    < > 31* 20 19 16 15   CREATININE  --    < > 1.18* 1.07* 0.94 0.99 0.93  CALCIUM  --    < > 8.9 8.6* 8.4* 8.8* 8.0*  MG 1.9  --  2.0 1.7 2.1 1.9 1.7  PHOS 3.1  --   --   --   --  1.1* 2.5   < > = values in this interval not displayed.   Estimated Creatinine Clearance: 47.7 mL/min (by C-G formula based on SCr of 0.93 mg/dL). Liver & Pancreas: Recent Labs  Lab 09/15/23 0505 09/16/23 0741 09/17/23 0447  AST 27  --   --   ALT 30  --   --   ALKPHOS 63  --   --   BILITOT 0.4  --   --   PROT 5.2*  --   --   ALBUMIN 2.4* 2.8* 2.1*   No results for input(s): LIPASE,  AMYLASE in the last 168 hours. No results for input(s): AMMONIA in the last 168 hours. Diabetic: No results for input(s): HGBA1C in the last 72 hours. No results for input(s): GLUCAP in the last 168 hours.  Cardiac Enzymes: Recent Labs  Lab 09/10/23 1601 09/16/23 0741  CKTOTAL 429* 76   No results for input(s): PROBNP in the last 8760 hours. Coagulation Profile: No results for input(s): INR, PROTIME in the last 168 hours. Thyroid  Function Tests: No results for input(s): TSH, T4TOTAL, FREET4, T3FREE, THYROIDAB in the last 72 hours. Lipid Profile: No results for input(s): CHOL, HDL, LDLCALC, TRIG, CHOLHDL, LDLDIRECT in the last 72 hours. Anemia Panel: No results for input(s): VITAMINB12, FOLATE, FERRITIN, TIBC, IRON, RETICCTPCT in the last 72 hours. Urine analysis:    Component Value Date/Time   COLORURINE YELLOW 09/16/2023 1153   APPEARANCEUR CLOUDY (A) 09/16/2023 1153   LABSPEC 1.017 09/16/2023 1153   PHURINE 5.0 09/16/2023 1153   GLUCOSEU NEGATIVE 09/16/2023 1153   HGBUR SMALL (A) 09/16/2023 1153   BILIRUBINUR NEGATIVE 09/16/2023 1153   KETONESUR NEGATIVE 09/16/2023 1153   PROTEINUR 30 (A) 09/16/2023 1153   NITRITE POSITIVE (A) 09/16/2023 1153   LEUKOCYTESUR LARGE (A) 09/16/2023 1153   Sepsis Labs: Invalid input(s): PROCALCITONIN, LACTICIDVEN  Microbiology: Recent Results (from the past 240 hours)  Gastrointestinal Panel by PCR , Stool     Status: None   Collection Time: 09/11/23  6:55 AM   Specimen: STOOL  Result Value Ref Range Status   Campylobacter species NOT DETECTED NOT DETECTED Final   Plesimonas shigelloides NOT DETECTED NOT DETECTED Final   Salmonella species NOT DETECTED NOT DETECTED Final   Yersinia enterocolitica NOT DETECTED NOT DETECTED Final   Vibrio species NOT DETECTED NOT DETECTED Final   Vibrio cholerae NOT DETECTED NOT DETECTED Final   Enteroaggregative E coli (EAEC) NOT DETECTED NOT DETECTED  Final   Enteropathogenic E coli (EPEC) NOT DETECTED NOT DETECTED Final   Enterotoxigenic E coli (ETEC) NOT DETECTED NOT DETECTED Final   Shiga like toxin producing E coli (STEC) NOT DETECTED NOT DETECTED Final   Shigella/Enteroinvasive E coli (EIEC) NOT DETECTED NOT DETECTED Final   Cryptosporidium NOT DETECTED NOT DETECTED Final   Cyclospora cayetanensis NOT DETECTED NOT DETECTED Final   Entamoeba  histolytica NOT DETECTED NOT DETECTED Final   Giardia lamblia NOT DETECTED NOT DETECTED Final   Adenovirus F40/41 NOT DETECTED NOT DETECTED Final   Astrovirus NOT DETECTED NOT DETECTED Final   Norovirus GI/GII NOT DETECTED NOT DETECTED Final   Rotavirus A NOT DETECTED NOT DETECTED Final   Sapovirus (I, II, IV, and V) NOT DETECTED NOT DETECTED Final    Comment: Performed at [facility], [address] [place], [place] [address]  C Difficile Quick Screen w PCR reflex     Status: None   Collection Time: 09/11/23  6:55 AM   Specimen: STOOL  Result Value Ref Range Status   C Diff antigen NEGATIVE NEGATIVE Final   C Diff toxin NEGATIVE NEGATIVE Final   C Diff interpretation No C. difficile detected.  Final    Comment: Performed at [facility], [address], [place], [place] [address]    Radiology Studies: No results found.    Ravon Mcilhenny T. Marius Betts [facility]  If 7PM-7AM, please contact night-coverage [email or web address] 09/17/2023, 12:49 PM

## 2023-09-17 NOTE — TOC Progression Note (Addendum)
 Transition of Care [facility]) - Progression Note    Patient Details  Name: Megan Harmon MRN: [id number] Date of Birth: 12/04/1932  [facility] CM/SW Contact  Lauraine FORBES Saa, LCSWA Phone Number: 09/17/2023, 10:16 AM  Clinical Narrative:     10:16 AM Per progressions, patient is not yet medically ready for discharge. Patient's SNF insurance authorization expires today at 17:00. CSW to submit new insurance authorization as patient becomes medically ready for discharge. CSW will continue to follow and be available to assist.  3:48 PM Per MD, patient can possible discharge to [facility] tomorrow. CSW made SNF admissions aware of patient's expected discharge date. SNF confirmed that they could admit patient tomorrow pending approved SNF insurance authorization. CSW attempted to submit new SNF insurance authorization with Tammy of [employer]. CSW awaits Tammy's call to submit new SNF insurance authorization.  4:16 PM Tammy informed CSW that patient's SNF insurance authorization has been extended until tomorrow, but if that patient does not discharge tomorrow, then a new SNF insurance authorization will be needed.  Expected Discharge Plan: Skilled Nursing Facility Barriers to Discharge: Continued Medical Work up               Expected Discharge Plan and Services In-house Referral: Clinical Social Work   Post Acute Care Choice: Skilled Nursing Facility Living arrangements for the past 2 months: Single Family Home                                       Social Drivers of Health (SDOH) Interventions SDOH Screenings   Food Insecurity: No Food Insecurity (09/10/2023)  Housing: Low Risk  (09/10/2023)  Transportation Needs: No Transportation Needs (09/10/2023)  Utilities: Not At Risk (09/10/2023)  Social Connections: Patient Unable To Answer (09/10/2023)  Tobacco Use: Low Risk  (09/10/2023)    Readmission Risk Interventions     No data to display

## 2023-09-17 NOTE — Progress Notes (Signed)
 Physical Therapy Treatment Patient Details Name: Megan Harmon MRN: [id number] DOB: 1932/07/23 Today's Date: 09/17/2023   History of Present Illness Megan Harmon is a 89 y.o. female who presented to the ED 8/29 for evaluation of generalized weakness. Found to have AKI and Acute metabolic encephalopathy possibly due to AKI.PHMX: anxiety and depression, osteoarthritis, hypertension and hypothyroidism.    PT Comments  Pt received sitting on bedside commode. Requiring light minA to power up to standing; pt son assisting with posterior peri care. Pt pivoted and sat on edge of bed and then ambulated an additional 15 ft with a walker before fatigue. Performed bed level exercises upon return to bed. Patient will benefit from continued inpatient follow up therapy, <3 hours/day.    If plan is discharge home, recommend the following: A little help with walking and/or transfers;A little help with bathing/dressing/bathroom;Assistance with cooking/housework;Assist for transportation;Help with stairs or ramp for entrance   Can travel by private vehicle     Yes  Equipment Recommendations  None recommended by PT    Recommendations for Other Services       Precautions / Restrictions Precautions Precautions: Fall Recall of Precautions/Restrictions: Impaired Restrictions Weight Bearing Restrictions Per Provider Order: No     Mobility  Bed Mobility Overal bed mobility: Needs Assistance Bed Mobility: Sit to Supine       Sit to supine: Mod assist   General bed mobility comments: ModA to elevate legs back into bed, +2 to pull up    Transfers Overall transfer level: Needs assistance Equipment used: Rolling walker (2 wheels) Transfers: Sit to/from Stand Sit to Stand: Min assist           General transfer comment: Verbal cues for hand placement, slow to rise, light minA to boost up and initially steady    Ambulation/Gait Ambulation/Gait assistance: Contact guard assist Gait  Distance (Feet): 15 Feet Assistive device: Rolling walker (2 wheels) Gait Pattern/deviations: Step-through pattern, Decreased stride length, Shuffle, Trunk flexed, Decreased dorsiflexion - right, Decreased dorsiflexion - left Gait velocity: decreased Gait velocity interpretation: <1.31 ft/sec, indicative of household ambulator   General Gait Details: Verbal cues for upright posture with limited carryover. CGA for safety and postural stability   Stairs             Wheelchair Mobility     Tilt Bed    Modified Rankin (Stroke Patients Only)       Balance Overall balance assessment: Needs assistance   Sitting balance-Leahy Scale: Fair       Standing balance-Leahy Scale: Poor                              Communication Communication Communication: Impaired Factors Affecting Communication: Hearing impaired  Cognition Arousal: Alert Behavior During Therapy: Flat affect   PT - Cognitive impairments: Memory, Safety/Judgement, Awareness                         Following commands: Impaired Following commands impaired: Follows one step commands with increased time    Cueing Cueing Techniques: Verbal cues, Tactile cues  Exercises General Exercises - Lower Extremity Heel Slides: AROM, Both, 5 reps, Supine Hip ABduction/ADduction: AROM, Both, 5 reps, Supine Straight Leg Raises: AROM, Both, 5 reps, Supine    General Comments        Pertinent Vitals/Pain Pain Assessment Pain Assessment: Faces Faces Pain Scale: Hurts a little bit Pain Location: back Pain Descriptors /  Indicators: Discomfort, Grimacing, Guarding, Sore Pain Intervention(s): Monitored during session, Repositioned    Home Living                          Prior Function            PT Goals (current goals can now be found in the care plan section) Acute Rehab PT Goals PT Goal Formulation: With patient/family Time For Goal Achievement: 09/24/23 Potential to Achieve  Goals: Fair    Frequency    Min 2X/week      PT Plan      Co-evaluation              AM-PAC PT 6 Clicks Mobility   Outcome Measure  Help needed turning from your back to your side while in a flat bed without using bedrails?: A Little Help needed moving from lying on your back to sitting on the side of a flat bed without using bedrails?: A Little Help needed moving to and from a bed to a chair (including a wheelchair)?: A Little Help needed standing up from a chair using your arms (e.g., wheelchair or bedside chair)?: A Little Help needed to walk in hospital room?: A Little Help needed climbing 3-5 steps with a railing? : Total 6 Click Score: 16    End of Session Equipment Utilized During Treatment: Gait belt Activity Tolerance: Patient tolerated treatment well Patient left: with call bell/phone within reach;with family/visitor present;in bed;with bed alarm set   PT Visit Diagnosis: Unsteadiness on feet (R26.81);Other abnormalities of gait and mobility (R26.89);Muscle weakness (generalized) (M62.81);Difficulty in walking, not elsewhere classified (R26.2)     Time: 9049-8981 PT Time Calculation (min) (ACUTE ONLY): 28 min  Charges:    $Therapeutic Activity: 23-37 mins PT General Charges $$ ACUTE PT VISIT: 1 Visit                     Aleck Daring, PT, DPT Acute Rehabilitation Services Office [phone]    Alayne ONEIDA Daring 09/17/2023, 10:27 AM

## 2023-09-17 NOTE — Plan of Care (Signed)

## 2023-09-18 DIAGNOSIS — R2681 Unsteadiness on feet: Secondary | ICD-10-CM | POA: Diagnosis not present

## 2023-09-18 DIAGNOSIS — F329 Major depressive disorder, single episode, unspecified: Secondary | ICD-10-CM | POA: Diagnosis not present

## 2023-09-18 DIAGNOSIS — L89313 Pressure ulcer of right buttock, stage 3: Secondary | ICD-10-CM | POA: Diagnosis not present

## 2023-09-18 DIAGNOSIS — R404 Transient alteration of awareness: Secondary | ICD-10-CM | POA: Diagnosis not present

## 2023-09-18 DIAGNOSIS — F32A Depression, unspecified: Secondary | ICD-10-CM | POA: Diagnosis not present

## 2023-09-18 DIAGNOSIS — N390 Urinary tract infection, site not specified: Secondary | ICD-10-CM | POA: Diagnosis not present

## 2023-09-18 DIAGNOSIS — E870 Hyperosmolality and hypernatremia: Secondary | ICD-10-CM | POA: Diagnosis not present

## 2023-09-18 DIAGNOSIS — R140 Abdominal distension (gaseous): Secondary | ICD-10-CM | POA: Diagnosis not present

## 2023-09-18 DIAGNOSIS — N179 Acute kidney failure, unspecified: Secondary | ICD-10-CM | POA: Diagnosis not present

## 2023-09-18 DIAGNOSIS — E876 Hypokalemia: Secondary | ICD-10-CM | POA: Diagnosis not present

## 2023-09-18 DIAGNOSIS — I10 Essential (primary) hypertension: Secondary | ICD-10-CM | POA: Diagnosis not present

## 2023-09-18 DIAGNOSIS — Z7401 Bed confinement status: Secondary | ICD-10-CM | POA: Diagnosis not present

## 2023-09-18 DIAGNOSIS — R339 Retention of urine, unspecified: Secondary | ICD-10-CM | POA: Diagnosis not present

## 2023-09-18 DIAGNOSIS — F419 Anxiety disorder, unspecified: Secondary | ICD-10-CM | POA: Diagnosis not present

## 2023-09-18 DIAGNOSIS — G9341 Metabolic encephalopathy: Secondary | ICD-10-CM | POA: Diagnosis not present

## 2023-09-18 DIAGNOSIS — R531 Weakness: Secondary | ICD-10-CM | POA: Diagnosis not present

## 2023-09-18 DIAGNOSIS — F339 Major depressive disorder, recurrent, unspecified: Secondary | ICD-10-CM | POA: Diagnosis not present

## 2023-09-18 DIAGNOSIS — L89322 Pressure ulcer of left buttock, stage 2: Secondary | ICD-10-CM | POA: Diagnosis not present

## 2023-09-18 DIAGNOSIS — E860 Dehydration: Secondary | ICD-10-CM | POA: Diagnosis not present

## 2023-09-18 DIAGNOSIS — M6281 Muscle weakness (generalized): Secondary | ICD-10-CM | POA: Diagnosis not present

## 2023-09-18 DIAGNOSIS — M1991 Primary osteoarthritis, unspecified site: Secondary | ICD-10-CM | POA: Diagnosis not present

## 2023-09-18 DIAGNOSIS — E440 Moderate protein-calorie malnutrition: Secondary | ICD-10-CM

## 2023-09-18 DIAGNOSIS — M1990 Unspecified osteoarthritis, unspecified site: Secondary | ICD-10-CM | POA: Diagnosis not present

## 2023-09-18 DIAGNOSIS — E039 Hypothyroidism, unspecified: Secondary | ICD-10-CM | POA: Diagnosis not present

## 2023-09-18 DIAGNOSIS — L988 Other specified disorders of the skin and subcutaneous tissue: Secondary | ICD-10-CM | POA: Diagnosis not present

## 2023-09-18 DIAGNOSIS — L89002 Pressure ulcer of unspecified elbow, stage 2: Secondary | ICD-10-CM

## 2023-09-18 LAB — RENAL FUNCTION PANEL
Albumin: 2.2 g/dL — ABNORMAL LOW (ref 3.5–5.0)
Anion gap: 8 (ref 5–15)
BUN: 14 mg/dL (ref 8–23)
CO2: 24 mmol/L (ref 22–32)
Calcium: 8.6 mg/dL — ABNORMAL LOW (ref 8.9–10.3)
Chloride: 110 mmol/L (ref 98–111)
Creatinine, Ser: 0.97 mg/dL (ref 0.44–1.00)
GFR, Estimated: 56 mL/min — ABNORMAL LOW (ref >60)
Glucose, Bld: 105 mg/dL — ABNORMAL HIGH (ref 70–99)
Phosphorus: 2.3 mg/dL — ABNORMAL LOW (ref 2.5–4.6)
Potassium: 3.9 mmol/L (ref 3.5–5.1)
Sodium: 142 mmol/L (ref 135–145)

## 2023-09-18 LAB — URINE CULTURE
Culture: >=100000 — AB
Special Requests: NORMAL

## 2023-09-18 LAB — CBC
HCT: 32.8 % — ABNORMAL LOW (ref 36.0–46.0)
Hemoglobin: 10.6 g/dL — ABNORMAL LOW (ref 12.0–15.0)
MCH: 31.4 pg (ref 26.0–34.0)
MCHC: 32.3 g/dL (ref 30.0–36.0)
MCV: 97 fL (ref 80.0–100.0)
Platelets: 318 K/uL (ref 150–400)
RBC: 3.38 MIL/uL — ABNORMAL LOW (ref 3.87–5.11)
RDW: 14.8 % (ref 11.5–15.5)
WBC: 11.1 K/uL — ABNORMAL HIGH (ref 4.0–10.5)
nRBC: 0 % (ref 0.0–0.2)

## 2023-09-18 LAB — MAGNESIUM: Magnesium: 1.9 mg/dL (ref 1.7–2.4)

## 2023-09-18 MED ORDER — ENSURE PLUS HIGH PROTEIN PO LIQD: 237.0000 mL | Freq: Three times a day (TID) | ORAL | Status: AC

## 2023-09-18 MED ORDER — OXYCODONE-ACETAMINOPHEN 5-325 MG PO TABS: 1.0000 | ORAL_TABLET | Freq: Two times a day (BID) | ORAL | 0 refills | Status: AC | PRN

## 2023-09-18 MED ORDER — MIRTAZAPINE 7.5 MG PO TABS: 7.5000 mg | ORAL_TABLET | Freq: Every day | ORAL | Status: AC

## 2023-09-18 MED ORDER — OXYCODONE-ACETAMINOPHEN 5-325 MG PO TABS: 1.0000 | ORAL_TABLET | Freq: Two times a day (BID) | ORAL | 0 refills | Status: DC | PRN

## 2023-09-18 MED ORDER — ALPRAZOLAM 0.5 MG PO TABS: 0.5000 mg | ORAL_TABLET | Freq: Two times a day (BID) | ORAL | 0 refills | Status: AC | PRN

## 2023-09-18 MED ORDER — BENAZEPRIL HCL 20 MG PO TABS
40.0000 mg | ORAL_TABLET | Freq: Every day | ORAL | Status: DC
  Administered 2023-09-18: 40 mg via ORAL
  Filled 2023-09-18: qty 2

## 2023-09-18 MED ORDER — CEFADROXIL 500 MG PO CAPS: 500.0000 mg | ORAL_CAPSULE | Freq: Two times a day (BID) | ORAL | Status: AC

## 2023-09-18 NOTE — TOC Transition Note (Signed)
 Transition of Care [facility]) - Discharge Note   Patient Details  Name: Megan Harmon MRN: [id number] Date of Birth: 05-29-1932  [facility] of [facility]) CM/SW Contact:  Gwenn Julien [place], [place] Phone Number: 09/18/2023, 11:30 AM   Clinical Narrative: Pt for dc to [facility] today. Spoke to Darrian in admissions who confirmed they are prepared to admit pt to room 1006. Pt's son [name] aware of dc and reports agreeable. RN provided with number for report. PTAR arranged for transport at son's request. SW signing off at dc.   Julien Gwenn, MSW, LCSW [phone] (coverage)        Final next level of care: Skilled Nursing Facility Barriers to Discharge: Barriers Resolved   Patient Goals and CMS Choice   CMS Medicare.gov Compare Post Acute Care list provided to:: Patient Represenative (must comment) Choice offered to / list presented to : Adult Children      Discharge Placement              Patient chooses bed at: [facility] Patient to be transferred to facility by: PTAR Name of family member notified: David/son Patient and family notified of of transfer: 09/18/23  Discharge Plan and Services Additional resources added to the After Visit Summary for   In-house Referral: Clinical Social Work   Post Acute Care Choice: Skilled Nursing Facility                               Social Drivers of Health (SDOH) Interventions SDOH Screenings   Food Insecurity: No Food Insecurity (09/10/2023)  Housing: Low Risk  (09/10/2023)  Transportation Needs: No Transportation Needs (09/10/2023)  Utilities: Not At Risk (09/10/2023)  Social Connections: Patient Unable To Answer (09/10/2023)  Tobacco Use: Low Risk  (09/10/2023)     Readmission Risk Interventions     No data to display

## 2023-09-18 NOTE — Discharge Summary (Signed)
 Physician Discharge Summary  Megan Harmon FMW:[id number] DOB: May 12, 1932 DOA: 09/10/2023  PCP: Megan Kins, MD  Admit date: 09/10/2023 Discharge date: 09/18/23  Admitted From: Home Disposition: SNF Recommendations for Outpatient Follow-up:   Assess blood pressure, CMP and CBC in 1 week Assess need for opiate and benzodiazepines and to wean off as able Consider titrating up Remeron  and weaning off Lexapro if she continues to tolerate Remeron  Please follow up on the following pending results: None   Discharge Condition: Stable CODE STATUS: DNR Diet Orders (From admission, onward)     Start     Ordered   09/16/23 1338  Diet regular Room service appropriate? No; Fluid consistency: Thin  Diet effective now       Question Answer Comment  Room service appropriate? No   Fluid consistency: Thin      09/16/23 1338             Contact information for after-discharge care     Destination     [facility] .   Service: Skilled Nursing Contact information: [address] [place]  [phone]                     Hospital course 89 year old F with PMH of HTN, anxiety, depression, osteoarthritis and hypothyroidism presented to ED with generalized weakness, abdominal distention, watery stool and decreased appetite.  Apparently, patient lost her spouse about 4 months ago and has had a gradual decline in her health. Patient currently lives alone and ambulates with a walker with family close by and monitoring closely via camera. Over the last 2 days, they have noticed patient has had significantly decrease in her mobility. Around 3 AM this morning, they noticed that patient was sitting on something and unable to get up. Son reports that when he checked on patient, she had been sitting on the bottom of her lift chair for hours and had significant difficulty getting up or moving her feet.  Patient was admitted for  AKI, dehydration, hypokalemia, acute metabolic encephalopathy and generalized weakness.  AKI, dehydration and encephalopathy resolved with IV fluid hydration and adjustment of home medications.  However, she developed leukocytosis.  UA consistent with UTI.  Started on IV ceftriaxone .  Patient had Foley catheter since admission that was removed on 9/3.  Urine culture with E. Coli.  Antibiotic de-escalated to p.o. cefadroxil  on discharge.    Therapy recommended SNF.  See individual problem list below for more.   Problems addressed during this hospitalization Acute kidney injury/dehydration-in the setting of poor p.o. intake, diarrhea and concurrent use of diuretics and ACE inhibitors.  Renal US  negative for hydronephrosis.  AKI resolved. -Encourage oral hydration -Discontinue diuretics indefinitely   Generalized weakness: Daughter concerned about patient's poor appetite.  Improved - Decrease narcotics - Continue PT/OT   Acute metabolic encephalopathy: No formal diagnosis of dementia.  She is currently awake and alert oriented. - Minimize sedating medication - Reorientation and delirium precautions   Diarrheal illness-CT scan without significant acute findings other than evidence of diarrheal illness.  C. difficile and GIP negative.  No fever or leukocytosis.  Abdominal exam benign.  Overflow diarrhea?  Resolved.   Hypernatremia-suspect due to poor p.o. intake.  Resolved.   Essential hypertension-BP slightly elevated - Increase benazepril  to home dose   Hypokalemia/hypophosphatemia: Resolved   Hyperlipidemia -continue statin   Hypothyroidism-TSH normal - Continue Synthroid    Anxiety/depression/grief/decreased appetite: episode of depression after losing her husband? -Continue low-dose  Remeron .  Consider titrating up if tolerated -Continue Celexa .  Consider weaning if able to tolerate Remeron  titration -Decreased Xanax .  Consider weaning off as able.   Anemia of chronic disease:  Anemia panel consistent with ACD. -recheck CBC at follow-up   Mild myonecrosis: CK430 and resolved..   Acute urinary retention?  Passed voiding trial on 9/3.   Chronic back pain: Fairly controlled. - Continue scheduled Tylenol   - Decrease as needed Percocet.   UTI due to Foley catheter: Not present on admission.  Had rising leukocytosis.  No objective fever but sense of feeling hot and cold as well.  Initially denied dysuria but she later admits.  She had a Foley catheter that was removed on 9/3.  UA consistent with UTI on 9/4. - Received IV ceftriaxone  for 2 days and discharged on p.o. cefadroxil  for 4 more days.   Leukocytosis/bandemia: Likely due to UTI.  Resolving. - Antibiotics as above   Class I obesity/poor appetite/moderate malnutrition Body mass index is 33.89 kg/m. Nutrition Problem: Moderate Malnutrition Etiology: chronic illness Signs/Symptoms: mild fat depletion, moderate muscle depletion Interventions: Ensure Enlive (each supplement provides 350kcal and 20 grams of protein), Magic cup, MVI, Refer to RD note for recommendations  Pressure injury to left buttocks stage 2, present on admission.  No signs of infection. - Continue offloading and close monitoring.        Consultations:  Time spent 35  minutes  Vital signs Vitals:   09/17/23 1547 09/17/23 2200 09/18/23 0600 09/18/23 0741  BP: (!) 123/55 (!) 157/64 (!) 161/75 (!) 158/66  Pulse: 77 (!) 57 65 69  Temp: 98 F (36.7 C) 98.6 F (37 C) 97.7 F (36.5 C) 98 F (36.7 C)  Resp:   18   Height:      Weight:      SpO2: 95% 99% 97%   TempSrc:  Axillary Oral   BMI (Calculated):         Discharge exam  GENERAL: No apparent distress.  Nontoxic. HEENT: MMM.  Vision and hearing grossly intact.  NECK: Supple.  No apparent JVD.  RESP:  No IWOB.  Fair aeration bilaterally. CVS:  RRR. Heart sounds normal.  ABD/GI/GU: BS+. Abd soft, NTND.  Foley catheter in place. MSK/EXT:  Moves extremities. No apparent  deformity. No edema.  SKIN: no apparent skin lesion or wound NEURO: AA.  Oriented x 4 except time.  No apparent focal neuro deficit. PSYCH: Calm. Normal affect.   Discharge Instructions Discharge Instructions     Discharge wound care:   Complete by: As directed    Pressure offloading and monitoring for buttock wound   Increase activity slowly   Complete by: As directed    Increase activity slowly   Complete by: As directed    No wound care   Complete by: As directed       Allergies as of 09/18/2023   No Known Allergies      Medication List     STOP taking these medications    furosemide  20 MG tablet Commonly known as: LASIX    hydrochlorothiazide  25 MG tablet Commonly known as: HYDRODIURIL    meloxicam  15 MG tablet Commonly known as: MOBIC    oxyCODONE -acetaminophen  10-325 MG tablet Commonly known as: PERCOCET Replaced by: oxyCODONE -acetaminophen  5-325 MG tablet       TAKE these medications    acetaminophen  325 MG tablet Commonly known as: TYLENOL  Take 2 tablets (650 mg total) by mouth 3 (three) times daily.   ALPRAZolam  0.5 MG tablet Commonly  known as: XANAX  Take 1 tablet (0.5 mg total) by mouth 2 (two) times daily as needed. What changed:  how much to take when to take this   benazepril  40 MG tablet Commonly known as: LOTENSIN  Take 1 tablet (40 mg total) by mouth daily.   bimatoprost 0.01 % Soln Commonly known as: LUMIGAN Place 1 drop into both eyes daily.   calcium carbonate 1250 (500 Ca) MG tablet Commonly known as: OS-CAL - dosed in mg of elemental calcium Take 1 tablet by mouth daily.   cefadroxil  500 MG capsule Commonly known as: DURICEF Take 1 capsule (500 mg total) by mouth 2 (two) times daily for 4 days.   citalopram  40 MG tablet Commonly known as: CELEXA  Take 1 tablet (40 mg total) by mouth daily.   dorzolamide-timolol 2-0.5 % ophthalmic solution Commonly known as: COSOPT Place 1 drop into both eyes 2 (two) times daily.    feeding supplement Liqd Take 237 mLs by mouth 3 (three) times daily between meals.   latanoprost 0.005 % ophthalmic solution Commonly known as: XALATAN Place 1 drop into both eyes at bedtime.   levothyroxine  150 MCG tablet Commonly known as: SYNTHROID  Take 1 tablet (150 mcg total) by mouth every morning on an empty stomach.   loperamide  2 MG capsule Commonly known as: IMODIUM  Take 1 capsule (2 mg total) by mouth as needed for diarrhea or loose stools.   mirtazapine  7.5 MG tablet Commonly known as: REMERON  Take 1 tablet (7.5 mg total) by mouth at bedtime.   MULTIVITAL PO Take 1 tablet by mouth daily.   oxyCODONE -acetaminophen  5-325 MG tablet Commonly known as: PERCOCET/ROXICET Take 1 tablet by mouth every 12 (twelve) hours as needed for severe pain (pain score 7-10) (pain). Replaces: oxyCODONE -acetaminophen  10-325 MG tablet   senna-docusate 8.6-50 MG tablet Commonly known as: Senokot-S Take 2 tablets by mouth 2 (two) times daily between meals as needed for mild constipation.   simvastatin  20 MG tablet Commonly known as: ZOCOR  Take 1 tablet (20 mg total) by mouth every evening.   STOOL SOFTENER PO Take 1 capsule by mouth 2 (two) times daily as needed (Constipation).   TART CHERRY PO Take 1 tablet by mouth daily.               Discharge Care Instructions  (From admission, onward)           Start     Ordered   09/18/23 0000  Discharge wound care:       Comments: Pressure offloading and monitoring for buttock wound   09/18/23 0913             Procedures/Studies:   CT ABDOMEN PELVIS WO CONTRAST Result Date: 09/11/2023 CLINICAL DATA:  Bowel obstruction EXAM: CT ABDOMEN AND PELVIS WITHOUT CONTRAST TECHNIQUE: Multidetector CT imaging of the abdomen and pelvis was performed following the standard protocol without IV contrast. RADIATION DOSE REDUCTION: This exam was performed according to the departmental dose-optimization program which includes  automated exposure control, adjustment of the mA and/or kV according to patient size and/or use of iterative reconstruction technique. COMPARISON:  None Available. FINDINGS: Lower chest: No acute abnormality.  Small hiatal hernia Hepatobiliary: No focal liver abnormality is seen. Status post cholecystectomy. There is progressive extrahepatic biliary ductal dilation, nonspecific but possibly reflecting post cholecystectomy change. Extrahepatic bile duct measures up to 17 mm in diameter and remains dilated to the level of the ampulla. No definite distal obstructing lesion though this is not optimally assessed on this noncontrast examination.  Pancreas: Atrophic but otherwise unremarkable. Spleen: Unremarkable Adrenals/Urinary Tract: Adrenal glands are unremarkable. Kidneys are normal, without renal calculi, focal lesion, or hydronephrosis. Bladder is decompressed with a Foley catheter balloon seen within its lumen. Stomach/Bowel: The colon is diffusely gas and fluid-filled with fluid extending into the rectal vault in keeping with a diarrheal state. Stomach and small bowel are unremarkable. Appendix absent. No free intraperitoneal gas or fluid Vascular/Lymphatic: Aortic atherosclerosis. No enlarged abdominal or pelvic lymph nodes. Reproductive: Status post hysterectomy. No adnexal masses. Other: Small bilateral fat containing inguinal hernias. No abdominopelvic ascites Musculoskeletal: No acute bone abnormality. No lytic or blastic bone lesion. Osseous structures are age appropriate. IMPRESSION: 1. Diffusely gas and fluid-filled colon with fluid extending into the rectal vault in keeping with a diarrheal state. No evidence of obstruction. 2. Progressive extrahepatic biliary ductal dilation, nonspecific but possibly reflecting post cholecystectomy change. No definite distal obstructing lesion though this is not optimally assessed on this noncontrast examination. Correlation with liver enzymes may be helpful to exclude  an obstructive process 3. Aortic atherosclerosis. Aortic Atherosclerosis (ICD10-I70.0). Electronically Signed   By: Dorethia Molt M.D.   On: 09/11/2023 04:18   DG Abd 1 View Result Date: 09/10/2023 CLINICAL DATA:  Abdominal distention EXAM: ABDOMEN - 1 VIEW COMPARISON:  Abdominal x-ray 10/28/2015 FINDINGS: There is diffuse gaseous dilatation of the colon to the level of the sigmoid. The sigmoid colon is dilated measuring up to 9.5 cm. Cholecystectomy clips are present. There are multiple phleboliths in the pelvis. No acute fractures are seen. IMPRESSION: Diffuse gaseous dilatation of the colon to the level of the sigmoid colon. Findings may represent colonic ileus or distal colonic obstruction. Sigmoid volvulus is not excluded. Consider further evaluation with CT. Electronically Signed   By: Greig Pique M.D.   On: 09/10/2023 18:30   US  RENAL Result Date: 09/10/2023 CLINICAL DATA:  Acute kidney injury EXAM: RENAL / URINARY TRACT ULTRASOUND COMPLETE COMPARISON:  CT abdomen and pelvis 11/20/2010 FINDINGS: Right Kidney: Renal measurements: 10.6 x 4.1 x 4.6 cm = volume: 104 mL. Echogenicity within normal limits. No hydronephrosis. There is a 1.0 x 0.9 x 1.1 cm cyst in the superior pole of the right kidney. Left Kidney: Renal measurements: 12.1 x 6.0 x 4.9 cm = volume: 187 mL. Echogenicity within normal limits. No mass or hydronephrosis visualized. Bladder: Appears normal for degree of bladder distention. Other: None. IMPRESSION: 1. No hydronephrosis. 2. 1.1 cm cyst in the superior pole of the right kidney. Electronically Signed   By: Greig Pique M.D.   On: 09/10/2023 18:15   DG Chest Port 1 View Result Date: 09/10/2023 CLINICAL DATA:  Weakness. EXAM: PORTABLE CHEST 1 VIEW COMPARISON:  January 22, 2020. FINDINGS: The heart size and mediastinal contours are within normal limits. Both lungs are clear. Elevated left hemidiaphragm is noted. Degenerative changes are seen involving both glenohumeral joints.  IMPRESSION: No active disease. Electronically Signed   By: Lynwood Landy Raddle M.D.   On: 09/10/2023 11:03       The results of significant diagnostics from this hospitalization (including imaging, microbiology, ancillary and laboratory) are listed below for reference.     Microbiology: Recent Results (from the past 240 hours)  Gastrointestinal Panel by PCR , Stool     Status: None   Collection Time: 09/11/23  6:55 AM   Specimen: STOOL  Result Value Ref Range Status   Campylobacter species NOT DETECTED NOT DETECTED Final   Plesimonas shigelloides NOT DETECTED NOT DETECTED Final   Salmonella  species NOT DETECTED NOT DETECTED Final   Yersinia enterocolitica NOT DETECTED NOT DETECTED Final   Vibrio species NOT DETECTED NOT DETECTED Final   Vibrio cholerae NOT DETECTED NOT DETECTED Final   Enteroaggregative E coli (EAEC) NOT DETECTED NOT DETECTED Final   Enteropathogenic E coli (EPEC) NOT DETECTED NOT DETECTED Final   Enterotoxigenic E coli (ETEC) NOT DETECTED NOT DETECTED Final   Shiga like toxin producing E coli (STEC) NOT DETECTED NOT DETECTED Final   Shigella/Enteroinvasive E coli (EIEC) NOT DETECTED NOT DETECTED Final   Cryptosporidium NOT DETECTED NOT DETECTED Final   Cyclospora cayetanensis NOT DETECTED NOT DETECTED Final   Entamoeba histolytica NOT DETECTED NOT DETECTED Final   Giardia lamblia NOT DETECTED NOT DETECTED Final   Adenovirus F40/41 NOT DETECTED NOT DETECTED Final   Astrovirus NOT DETECTED NOT DETECTED Final   Norovirus GI/GII NOT DETECTED NOT DETECTED Final   Rotavirus A NOT DETECTED NOT DETECTED Final   Sapovirus (I, II, IV, and V) NOT DETECTED NOT DETECTED Final    Comment: Performed at [facility], [address] [place], [place] [address]  C Difficile Quick Screen w PCR reflex     Status: None   Collection Time: 09/11/23  6:55 AM   Specimen: STOOL  Result Value Ref Range Status   C Diff antigen NEGATIVE NEGATIVE Final   C Diff toxin NEGATIVE  NEGATIVE Final   C Diff interpretation No C. difficile detected.  Final    Comment: Performed at [facility], [address], [place], [place] [address]  Urine Culture     Status: Abnormal   Collection Time: 09/16/23  4:24 PM   Specimen: Urine, Clean Catch  Result Value Ref Range Status   Specimen Description URINE, CLEAN CATCH  Final   Special Requests Normal  Final   Culture (A)  Final    >=100,000 COLONIES/mL ESCHERICHIA COLI Two isolates with different morphologies were identified as the same organism.The most resistant organism was reported. Performed at [facility], [address], [place], [place] [address]    Report Status 09/18/2023 FINAL  Final   Organism ID, Bacteria ESCHERICHIA COLI (A)  Final      Susceptibility   Escherichia coli - MIC*    AMPICILLIN >=32 RESISTANT Resistant     CEFAZOLIN (URINE) Value in next row Sensitive      4 SENSITIVEThis is a modified FDA-approved test that has been validated and its performance characteristics determined by the reporting laboratory.  This laboratory is certified under the Clinical Laboratory Improvement Amendments CLIA as qualified to perform high complexity clinical laboratory testing.    CEFEPIME Value in next row Sensitive      4 SENSITIVEThis is a modified FDA-approved test that has been validated and its performance characteristics determined by the reporting laboratory.  This laboratory is certified under the Clinical Laboratory Improvement Amendments CLIA as qualified to perform high complexity clinical laboratory testing.    ERTAPENEM Value in next row Sensitive      4 SENSITIVEThis is a modified FDA-approved test that has been validated and its performance characteristics determined by the reporting laboratory.  This laboratory is certified under the Clinical Laboratory Improvement Amendments CLIA as qualified to perform high complexity clinical laboratory testing.    CEFTRIAXONE  Value in next row Sensitive       4 SENSITIVEThis is a modified FDA-approved test that has been validated and its performance characteristics determined by the reporting laboratory.  This laboratory is certified under  the Clinical Laboratory Improvement Amendments CLIA as qualified to perform high complexity clinical laboratory testing.    CIPROFLOXACIN Value in next row Sensitive      4 SENSITIVEThis is a modified FDA-approved test that has been validated and its performance characteristics determined by the reporting laboratory.  This laboratory is certified under the Clinical Laboratory Improvement Amendments CLIA as qualified to perform high complexity clinical laboratory testing.    GENTAMICIN Value in next row Sensitive      4 SENSITIVEThis is a modified FDA-approved test that has been validated and its performance characteristics determined by the reporting laboratory.  This laboratory is certified under the Clinical Laboratory Improvement Amendments CLIA as qualified to perform high complexity clinical laboratory testing.    NITROFURANTOIN Value in next row Sensitive      4 SENSITIVEThis is a modified FDA-approved test that has been validated and its performance characteristics determined by the reporting laboratory.  This laboratory is certified under the Clinical Laboratory Improvement Amendments CLIA as qualified to perform high complexity clinical laboratory testing.    TRIMETH/SULFA Value in next row Sensitive      4 SENSITIVEThis is a modified FDA-approved test that has been validated and its performance characteristics determined by the reporting laboratory.  This laboratory is certified under the Clinical Laboratory Improvement Amendments CLIA as qualified to perform high complexity clinical laboratory testing.    AMPICILLIN/SULBACTAM Value in next row Resistant      4 SENSITIVEThis is a modified FDA-approved test that has been validated and its performance characteristics determined by the reporting laboratory.  This  laboratory is certified under the Clinical Laboratory Improvement Amendments CLIA as qualified to perform high complexity clinical laboratory testing.    PIP/TAZO Value in next row Sensitive ug/mL     <=4 SENSITIVEThis is a modified FDA-approved test that has been validated and its performance characteristics determined by the reporting laboratory.  This laboratory is certified under the Clinical Laboratory Improvement Amendments CLIA as qualified to perform high complexity clinical laboratory testing.    MEROPENEM Value in next row Sensitive      <=4 SENSITIVEThis is a modified FDA-approved test that has been validated and its performance characteristics determined by the reporting laboratory.  This laboratory is certified under the Clinical Laboratory Improvement Amendments CLIA as qualified to perform high complexity clinical laboratory testing.    * >=100,000 COLONIES/mL ESCHERICHIA COLI     Labs:  CBC: Recent Labs  Lab 09/15/23 0505 09/16/23 0741 09/16/23 1026 09/17/23 0447 09/18/23 0404  WBC 12.7* 18.8* 17.4* 20.3* 11.1*  NEUTROABS  --   --  14.6*  --   --   HGB 10.5* 12.1 10.7* 9.8* 10.6*  HCT 31.6* 37.9 32.3* 29.8* 32.8*  MCV 94.3 96.9 95.8 94.6 97.0  PLT 365 335 363 299 318   BMP &GFR Recent Labs  Lab 09/14/23 0810 09/15/23 0505 09/16/23 0741 09/17/23 0447 09/18/23 0404  NA 142 139 143 141 142  K 2.9* 3.4* 4.5 3.7 3.9  CL 109 110 109 113* 110  CO2 22 21* 20* 22 24  GLUCOSE 102* 84 90 112* 105*  BUN 20 19 16 15 14   CREATININE 1.07* 0.94 0.99 0.93 0.97  CALCIUM 8.6* 8.4* 8.8* 8.0* 8.6*  MG 1.7 2.1 1.9 1.7 1.9  PHOS  --   --  1.1* 2.5 2.3*   Estimated Creatinine Clearance: 45.7 mL/min (by C-G formula based on SCr of 0.97 mg/dL). Liver & Pancreas: Recent Labs  Lab 09/15/23 0505 09/16/23 0741 09/17/23  9552 09/18/23 0404  AST 27  --   --   --   ALT 30  --   --   --   ALKPHOS 63  --   --   --   BILITOT 0.4  --   --   --   PROT 5.2*  --   --   --   ALBUMIN  2.4* 2.8* 2.1* 2.2*   No results for input(s): LIPASE, AMYLASE in the last 168 hours. No results for input(s): AMMONIA in the last 168 hours. Diabetic: No results for input(s): HGBA1C in the last 72 hours. No results for input(s): GLUCAP in the last 168 hours. Cardiac Enzymes: Recent Labs  Lab 09/16/23 0741  CKTOTAL 76   No results for input(s): PROBNP in the last 8760 hours. Coagulation Profile: No results for input(s): INR, PROTIME in the last 168 hours. Thyroid  Function Tests: No results for input(s): TSH, T4TOTAL, FREET4, T3FREE, THYROIDAB in the last 72 hours. Lipid Profile: No results for input(s): CHOL, HDL, LDLCALC, TRIG, CHOLHDL, LDLDIRECT in the last 72 hours. Anemia Panel: No results for input(s): VITAMINB12, FOLATE, FERRITIN, TIBC, IRON, RETICCTPCT in the last 72 hours. Urine analysis:    Component Value Date/Time   COLORURINE YELLOW 09/16/2023 1153   APPEARANCEUR CLOUDY (A) 09/16/2023 1153   LABSPEC 1.017 09/16/2023 1153   PHURINE 5.0 09/16/2023 1153   GLUCOSEU NEGATIVE 09/16/2023 1153   HGBUR SMALL (A) 09/16/2023 1153   BILIRUBINUR NEGATIVE 09/16/2023 1153   KETONESUR NEGATIVE 09/16/2023 1153   PROTEINUR 30 (A) 09/16/2023 1153   NITRITE POSITIVE (A) 09/16/2023 1153   LEUKOCYTESUR LARGE (A) 09/16/2023 1153   Sepsis Labs: Invalid input(s): PROCALCITONIN, LACTICIDVEN   SIGNED:  Tashay Bozich T Izzy Doubek, MD  [facility] 09/18/2023, 9:13 AM

## 2023-09-20 DIAGNOSIS — N390 Urinary tract infection, site not specified: Secondary | ICD-10-CM | POA: Diagnosis not present

## 2023-09-20 DIAGNOSIS — I10 Essential (primary) hypertension: Secondary | ICD-10-CM | POA: Diagnosis not present

## 2023-09-20 DIAGNOSIS — E870 Hyperosmolality and hypernatremia: Secondary | ICD-10-CM | POA: Diagnosis not present

## 2023-09-20 DIAGNOSIS — F329 Major depressive disorder, single episode, unspecified: Secondary | ICD-10-CM | POA: Diagnosis not present

## 2023-09-20 DIAGNOSIS — R339 Retention of urine, unspecified: Secondary | ICD-10-CM | POA: Diagnosis not present

## 2023-09-20 DIAGNOSIS — L89322 Pressure ulcer of left buttock, stage 2: Secondary | ICD-10-CM | POA: Diagnosis not present

## 2023-09-20 DIAGNOSIS — N179 Acute kidney failure, unspecified: Secondary | ICD-10-CM | POA: Diagnosis not present

## 2023-09-20 DIAGNOSIS — F419 Anxiety disorder, unspecified: Secondary | ICD-10-CM | POA: Diagnosis not present

## 2023-09-20 DIAGNOSIS — G9341 Metabolic encephalopathy: Secondary | ICD-10-CM | POA: Diagnosis not present

## 2023-09-21 DIAGNOSIS — L988 Other specified disorders of the skin and subcutaneous tissue: Secondary | ICD-10-CM | POA: Diagnosis not present

## 2023-09-21 DIAGNOSIS — I10 Essential (primary) hypertension: Secondary | ICD-10-CM | POA: Diagnosis not present

## 2023-09-21 DIAGNOSIS — F32A Depression, unspecified: Secondary | ICD-10-CM | POA: Diagnosis not present

## 2023-09-21 DIAGNOSIS — E039 Hypothyroidism, unspecified: Secondary | ICD-10-CM | POA: Diagnosis not present

## 2023-09-21 DIAGNOSIS — E876 Hypokalemia: Secondary | ICD-10-CM | POA: Diagnosis not present

## 2023-09-21 DIAGNOSIS — M1990 Unspecified osteoarthritis, unspecified site: Secondary | ICD-10-CM | POA: Diagnosis not present

## 2023-09-21 DIAGNOSIS — G9341 Metabolic encephalopathy: Secondary | ICD-10-CM | POA: Diagnosis not present

## 2023-09-21 DIAGNOSIS — F419 Anxiety disorder, unspecified: Secondary | ICD-10-CM | POA: Diagnosis not present

## 2023-09-21 DIAGNOSIS — N179 Acute kidney failure, unspecified: Secondary | ICD-10-CM | POA: Diagnosis not present

## 2023-09-21 DIAGNOSIS — L89313 Pressure ulcer of right buttock, stage 3: Secondary | ICD-10-CM | POA: Diagnosis not present

## 2023-09-22 DIAGNOSIS — R339 Retention of urine, unspecified: Secondary | ICD-10-CM | POA: Diagnosis not present

## 2023-09-22 DIAGNOSIS — F419 Anxiety disorder, unspecified: Secondary | ICD-10-CM | POA: Diagnosis not present

## 2023-09-22 DIAGNOSIS — I10 Essential (primary) hypertension: Secondary | ICD-10-CM | POA: Diagnosis not present

## 2023-09-22 DIAGNOSIS — N179 Acute kidney failure, unspecified: Secondary | ICD-10-CM | POA: Diagnosis not present

## 2023-09-22 DIAGNOSIS — F329 Major depressive disorder, single episode, unspecified: Secondary | ICD-10-CM | POA: Diagnosis not present

## 2023-09-22 DIAGNOSIS — G9341 Metabolic encephalopathy: Secondary | ICD-10-CM | POA: Diagnosis not present

## 2023-09-22 DIAGNOSIS — N390 Urinary tract infection, site not specified: Secondary | ICD-10-CM | POA: Diagnosis not present

## 2023-09-22 DIAGNOSIS — L89322 Pressure ulcer of left buttock, stage 2: Secondary | ICD-10-CM | POA: Diagnosis not present

## 2023-09-22 DIAGNOSIS — E870 Hyperosmolality and hypernatremia: Secondary | ICD-10-CM | POA: Diagnosis not present

## 2023-09-24 DIAGNOSIS — N390 Urinary tract infection, site not specified: Secondary | ICD-10-CM | POA: Diagnosis not present

## 2023-09-24 DIAGNOSIS — F419 Anxiety disorder, unspecified: Secondary | ICD-10-CM | POA: Diagnosis not present

## 2023-09-24 DIAGNOSIS — F329 Major depressive disorder, single episode, unspecified: Secondary | ICD-10-CM | POA: Diagnosis not present

## 2023-09-24 DIAGNOSIS — I10 Essential (primary) hypertension: Secondary | ICD-10-CM | POA: Diagnosis not present

## 2023-09-24 DIAGNOSIS — E870 Hyperosmolality and hypernatremia: Secondary | ICD-10-CM | POA: Diagnosis not present

## 2023-09-24 DIAGNOSIS — N179 Acute kidney failure, unspecified: Secondary | ICD-10-CM | POA: Diagnosis not present

## 2023-09-24 DIAGNOSIS — G9341 Metabolic encephalopathy: Secondary | ICD-10-CM | POA: Diagnosis not present

## 2023-09-24 DIAGNOSIS — R339 Retention of urine, unspecified: Secondary | ICD-10-CM | POA: Diagnosis not present

## 2023-09-24 DIAGNOSIS — L89322 Pressure ulcer of left buttock, stage 2: Secondary | ICD-10-CM | POA: Diagnosis not present

## 2023-09-27 DIAGNOSIS — F419 Anxiety disorder, unspecified: Secondary | ICD-10-CM | POA: Diagnosis not present

## 2023-09-27 DIAGNOSIS — F329 Major depressive disorder, single episode, unspecified: Secondary | ICD-10-CM | POA: Diagnosis not present

## 2023-09-27 DIAGNOSIS — I10 Essential (primary) hypertension: Secondary | ICD-10-CM | POA: Diagnosis not present

## 2023-09-27 DIAGNOSIS — N390 Urinary tract infection, site not specified: Secondary | ICD-10-CM | POA: Diagnosis not present

## 2023-09-27 DIAGNOSIS — L89322 Pressure ulcer of left buttock, stage 2: Secondary | ICD-10-CM | POA: Diagnosis not present

## 2023-09-27 DIAGNOSIS — N179 Acute kidney failure, unspecified: Secondary | ICD-10-CM | POA: Diagnosis not present

## 2023-09-27 DIAGNOSIS — G9341 Metabolic encephalopathy: Secondary | ICD-10-CM | POA: Diagnosis not present

## 2023-09-27 DIAGNOSIS — E870 Hyperosmolality and hypernatremia: Secondary | ICD-10-CM | POA: Diagnosis not present

## 2023-09-28 DIAGNOSIS — L89313 Pressure ulcer of right buttock, stage 3: Secondary | ICD-10-CM | POA: Diagnosis not present

## 2023-09-29 DIAGNOSIS — F419 Anxiety disorder, unspecified: Secondary | ICD-10-CM | POA: Diagnosis not present

## 2023-09-29 DIAGNOSIS — E870 Hyperosmolality and hypernatremia: Secondary | ICD-10-CM | POA: Diagnosis not present

## 2023-09-29 DIAGNOSIS — L89322 Pressure ulcer of left buttock, stage 2: Secondary | ICD-10-CM | POA: Diagnosis not present

## 2023-09-29 DIAGNOSIS — N390 Urinary tract infection, site not specified: Secondary | ICD-10-CM | POA: Diagnosis not present

## 2023-09-29 DIAGNOSIS — F329 Major depressive disorder, single episode, unspecified: Secondary | ICD-10-CM | POA: Diagnosis not present

## 2023-09-29 DIAGNOSIS — G9341 Metabolic encephalopathy: Secondary | ICD-10-CM | POA: Diagnosis not present

## 2023-09-29 DIAGNOSIS — I10 Essential (primary) hypertension: Secondary | ICD-10-CM | POA: Diagnosis not present

## 2023-09-29 DIAGNOSIS — N179 Acute kidney failure, unspecified: Secondary | ICD-10-CM | POA: Diagnosis not present

## 2023-10-05 DIAGNOSIS — L89313 Pressure ulcer of right buttock, stage 3: Secondary | ICD-10-CM | POA: Diagnosis not present

## 2023-10-07 ENCOUNTER — Other Ambulatory Visit (HOSPITAL_COMMUNITY): Payer: Self-pay

## 2023-10-11 ENCOUNTER — Other Ambulatory Visit (HOSPITAL_COMMUNITY): Payer: Self-pay

## 2023-10-11 MED ORDER — SIMVASTATIN 20 MG PO TABS
20.0000 mg | ORAL_TABLET | Freq: Every evening | ORAL | 2 refills | Status: AC
  Filled 2023-10-11: qty 30, 30d supply, fill #0

## 2023-10-12 ENCOUNTER — Other Ambulatory Visit (HOSPITAL_COMMUNITY): Payer: Self-pay

## 2023-10-12 ENCOUNTER — Other Ambulatory Visit: Payer: Self-pay

## 2023-10-12 DIAGNOSIS — F330 Major depressive disorder, recurrent, mild: Secondary | ICD-10-CM | POA: Diagnosis not present

## 2023-10-12 DIAGNOSIS — H409 Unspecified glaucoma: Secondary | ICD-10-CM | POA: Diagnosis not present

## 2023-10-12 DIAGNOSIS — E039 Hypothyroidism, unspecified: Secondary | ICD-10-CM | POA: Diagnosis not present

## 2023-10-12 DIAGNOSIS — E669 Obesity, unspecified: Secondary | ICD-10-CM | POA: Diagnosis not present

## 2023-10-12 MED ORDER — LEVOTHYROXINE SODIUM 150 MCG PO TABS
150.0000 ug | ORAL_TABLET | Freq: Every morning | ORAL | 2 refills | Status: AC
  Filled 2023-10-12 – 2024-01-07 (×4): qty 30, 30d supply, fill #0
  Filled 2024-02-02: qty 30, 30d supply, fill #1

## 2023-10-12 MED ORDER — CALCIUM CARBONATE 1250 (500 CA) MG PO TABS: 1.0000 | ORAL_TABLET | Freq: Every morning | ORAL | 2 refills | Status: AC

## 2023-10-12 MED ORDER — DORZOLAMIDE HCL-TIMOLOL MAL PF 2-0.5 % OP SOLN
1.0000 [drp] | Freq: Two times a day (BID) | OPHTHALMIC | 2 refills | Status: AC
  Filled 2023-10-12 – 2023-10-21 (×3): qty 60, 30d supply, fill #0
  Filled 2024-01-04: qty 60, fill #0

## 2023-10-12 MED ORDER — CITALOPRAM HYDROBROMIDE 40 MG PO TABS
40.0000 mg | ORAL_TABLET | Freq: Every day | ORAL | 2 refills | Status: AC
  Filled 2024-02-02: qty 30, 30d supply, fill #0

## 2023-10-12 MED ORDER — BENAZEPRIL HCL 40 MG PO TABS
40.0000 mg | ORAL_TABLET | Freq: Every day | ORAL | 2 refills | Status: AC
  Filled 2023-10-12 (×2): qty 30, 30d supply, fill #0
  Filled 2024-01-04 – 2024-01-07 (×3): qty 30, 30d supply, fill #1
  Filled 2024-02-02: qty 30, 30d supply, fill #2

## 2023-10-13 ENCOUNTER — Other Ambulatory Visit (HOSPITAL_COMMUNITY): Payer: Self-pay

## 2023-10-13 ENCOUNTER — Other Ambulatory Visit: Payer: Self-pay

## 2023-10-18 DIAGNOSIS — E039 Hypothyroidism, unspecified: Secondary | ICD-10-CM | POA: Diagnosis not present

## 2023-10-18 DIAGNOSIS — N390 Urinary tract infection, site not specified: Secondary | ICD-10-CM | POA: Diagnosis not present

## 2023-10-18 DIAGNOSIS — E860 Dehydration: Secondary | ICD-10-CM | POA: Diagnosis not present

## 2023-10-18 DIAGNOSIS — H409 Unspecified glaucoma: Secondary | ICD-10-CM | POA: Diagnosis not present

## 2023-10-18 DIAGNOSIS — N179 Acute kidney failure, unspecified: Secondary | ICD-10-CM | POA: Diagnosis not present

## 2023-10-18 DIAGNOSIS — M899 Disorder of bone, unspecified: Secondary | ICD-10-CM | POA: Diagnosis not present

## 2023-10-18 DIAGNOSIS — R627 Adult failure to thrive: Secondary | ICD-10-CM | POA: Diagnosis not present

## 2023-10-18 DIAGNOSIS — K219 Gastro-esophageal reflux disease without esophagitis: Secondary | ICD-10-CM | POA: Diagnosis not present

## 2023-10-18 DIAGNOSIS — F330 Major depressive disorder, recurrent, mild: Secondary | ICD-10-CM | POA: Diagnosis not present

## 2023-10-18 DIAGNOSIS — M1990 Unspecified osteoarthritis, unspecified site: Secondary | ICD-10-CM | POA: Diagnosis not present

## 2023-10-18 DIAGNOSIS — I10 Essential (primary) hypertension: Secondary | ICD-10-CM | POA: Diagnosis not present

## 2023-10-18 DIAGNOSIS — E782 Mixed hyperlipidemia: Secondary | ICD-10-CM | POA: Diagnosis not present

## 2023-10-18 DIAGNOSIS — F411 Generalized anxiety disorder: Secondary | ICD-10-CM | POA: Diagnosis not present

## 2023-10-21 ENCOUNTER — Other Ambulatory Visit (HOSPITAL_COMMUNITY): Payer: Self-pay

## 2023-10-21 DIAGNOSIS — F330 Major depressive disorder, recurrent, mild: Secondary | ICD-10-CM | POA: Diagnosis not present

## 2023-10-21 DIAGNOSIS — Z23 Encounter for immunization: Secondary | ICD-10-CM | POA: Diagnosis not present

## 2023-10-21 DIAGNOSIS — I10 Essential (primary) hypertension: Secondary | ICD-10-CM | POA: Diagnosis not present

## 2023-10-21 DIAGNOSIS — M1990 Unspecified osteoarthritis, unspecified site: Secondary | ICD-10-CM | POA: Diagnosis not present

## 2023-10-21 DIAGNOSIS — R5381 Other malaise: Secondary | ICD-10-CM | POA: Diagnosis not present

## 2023-10-21 DIAGNOSIS — E039 Hypothyroidism, unspecified: Secondary | ICD-10-CM | POA: Diagnosis not present

## 2023-10-21 DIAGNOSIS — N179 Acute kidney failure, unspecified: Secondary | ICD-10-CM | POA: Diagnosis not present

## 2023-10-21 DIAGNOSIS — H6123 Impacted cerumen, bilateral: Secondary | ICD-10-CM | POA: Diagnosis not present

## 2023-10-22 ENCOUNTER — Other Ambulatory Visit (HOSPITAL_COMMUNITY): Payer: Self-pay

## 2023-10-22 ENCOUNTER — Other Ambulatory Visit: Payer: Self-pay

## 2023-10-26 ENCOUNTER — Other Ambulatory Visit (HOSPITAL_COMMUNITY): Payer: Self-pay

## 2023-10-26 ENCOUNTER — Encounter: Payer: Self-pay | Admitting: Pharmacist

## 2023-10-26 ENCOUNTER — Other Ambulatory Visit: Payer: Self-pay

## 2023-11-29 ENCOUNTER — Other Ambulatory Visit (HOSPITAL_COMMUNITY): Payer: Self-pay

## 2023-11-29 MED ORDER — OXYCODONE-ACETAMINOPHEN 10-325 MG PO TABS
1.0000 | ORAL_TABLET | Freq: Four times a day (QID) | ORAL | 0 refills | Status: DC | PRN
  Filled 2023-11-29: qty 120, 30d supply, fill #0

## 2023-11-30 ENCOUNTER — Other Ambulatory Visit (HOSPITAL_COMMUNITY): Payer: Self-pay

## 2023-11-30 ENCOUNTER — Other Ambulatory Visit: Payer: Self-pay

## 2023-11-30 MED ORDER — HYDROCHLOROTHIAZIDE 25 MG PO TABS
25.0000 mg | ORAL_TABLET | Freq: Every morning | ORAL | 1 refills | Status: AC
  Filled 2023-11-30: qty 100, 100d supply, fill #0
  Filled 2024-01-04: qty 100, 100d supply, fill #1

## 2023-11-30 MED ORDER — ALPRAZOLAM 0.5 MG PO TABS
0.2500 mg | ORAL_TABLET | Freq: Three times a day (TID) | ORAL | 0 refills | Status: DC | PRN
  Filled 2023-11-30 – 2023-12-01 (×2): qty 90, 30d supply, fill #0

## 2023-11-30 MED ORDER — MELOXICAM 15 MG PO TABS
15.0000 mg | ORAL_TABLET | Freq: Every day | ORAL | 1 refills | Status: AC
  Filled 2023-11-30: qty 100, 100d supply, fill #0
  Filled 2024-01-04 – 2024-02-02 (×2): qty 100, 100d supply, fill #1

## 2023-12-01 ENCOUNTER — Other Ambulatory Visit (HOSPITAL_COMMUNITY): Payer: Self-pay

## 2023-12-01 ENCOUNTER — Other Ambulatory Visit: Payer: Self-pay

## 2023-12-22 ENCOUNTER — Ambulatory Visit: Admitting: Podiatry

## 2023-12-22 DIAGNOSIS — L84 Corns and callosities: Secondary | ICD-10-CM

## 2023-12-22 NOTE — Progress Notes (Unsigned)
 Subjective:  Patient ID: Megan Harmon, female    DOB: [birth year],  MRN: [id number]  Chief Complaint  Patient presents with   Foot Ulcer    89 y.o. female presents with the above complaint.  Patient presents with a left hallux preulcerative callus.  He has a history of ulceration in the left foot.  Wanted to get it evaluated make sure that there is no ulceration she denies any other acute complaints would like to discuss treatment options for this.  She is also make sure that there is no ulceration.  Denies any other acute complaints   Review of Systems: Negative except as noted in the HPI. Denies N/V/F/Ch.  Past Medical History:  Diagnosis Date   Arthritis    Hypertension    Current Medications[1]  Tobacco Use History[2]  Allergies[3] Objective:  There were no vitals filed for this visit. There is no height or weight on file to calculate BMI. Constitutional Well developed. Well nourished.  Vascular Dorsalis pedis pulses palpable bilaterally. Posterior tibial pulses palpable bilaterally. Capillary refill normal to all digits.  No cyanosis or clubbing noted. Pedal hair growth normal.  Neurologic Normal speech. Oriented to person, place, and time. Epicritic sensation to light touch grossly present bilaterally.  Dermatologic Left hallux preulcerative callus.  No open wounds or deeper wound noted.  No clinical signs of infection noted.  No breakdown of skin noted.  No malodor present does not probe down to any tissue  Orthopedic: Normal joint ROM without pain or crepitus bilaterally. No visible deformities. No bony tenderness.   Radiographs: None Assessment:   1. Pre-ulcerative calluses    Plan:  Patient was evaluated and treated and all questions answered.  Left hallux preulcerative callus - All questions and concerns were discussed with the patient in extensive detail - Given that patient does not have an open wound at this time we will continue to clinically  monitor - Shoe gear modification discussed extensively - If there is any redness or clinical signs of infection come see me right away she states understanding.  No follow-ups on file.    [1]  Current Outpatient Medications:    acetaminophen  (TYLENOL ) 325 MG tablet, Take 2 tablets (650 mg total) by mouth 3 (three) times daily., Disp: , Rfl:    ALPRAZolam  (XANAX ) 0.5 MG tablet, Take 1 tablet (0.5 mg total) by mouth 2 (two) times daily as needed., Disp: 30 tablet, Rfl: 0   ALPRAZolam  (XANAX ) 0.5 MG tablet, Take 0.5-1 tablets (0.25-0.5 mg total) by mouth 3 (three) times daily as needed for anxiety, Disp: 90 tablet, Rfl: 0   benazepril  (LOTENSIN ) 40 MG tablet, Take 1 tablet (40 mg total) by mouth daily., Disp: 100 tablet, Rfl: 0   benazepril  (LOTENSIN ) 40 MG tablet, Take 1 tablet (40 mg) by mouth once a day DO NOT TAKE MEDICATION IF BLOOD PRESSUE IS LOW. Systolic BP < 90 mmHg or Diastolic BP < 60 mmHg, Disp: 30 tablet, Rfl: 2   bimatoprost (LUMIGAN) 0.01 % SOLN, Place 1 drop into both eyes daily.  , Disp: , Rfl:    calcium  carbonate (OS-CAL - DOSED IN MG OF ELEMENTAL CALCIUM ) 1250 (500 Ca) MG tablet, Take 1 tablet (1,250 mg total) by mouth every morning., Disp: 30 tablet, Rfl: 2   calcium  carbonate (OS-CAL - DOSED IN MG OF ELEMENTAL CALCIUM ) 1250 MG tablet, Take 1 tablet by mouth daily.  , Disp: , Rfl:    citalopram  (CELEXA ) 40 MG tablet, Take 1 tablet (40 mg total)  by mouth daily., Disp: 100 tablet, Rfl: 1   citalopram  (CELEXA ) 40 MG tablet, Take 1 tablet (40 mg total) by mouth daily., Disp: 30 tablet, Rfl: 2   Docusate Calcium  (STOOL SOFTENER PO), Take 1 capsule by mouth 2 (two) times daily as needed (Constipation)., Disp: , Rfl:    Dorzolamide  HCl-Timolol  Mal PF 2-0.5 % SOLN, Place 1 drop into both eyes 2 (two) times daily. Wait 3-5 minutes between eye drops., Disp: 60 each, Rfl: 2   dorzolamide -timolol  (COSOPT ) 22.3-6.8 MG/ML ophthalmic solution, Place 1 drop into both eyes 2 (two) times daily.   , Disp: , Rfl:    feeding supplement (ENSURE PLUS HIGH PROTEIN) LIQD, Take 237 mLs by mouth 3 (three) times daily between meals., Disp: , Rfl:    hydrochlorothiazide  (HYDRODIURIL ) 25 MG tablet, Take 1 tablet (25 mg total) by mouth every morning., Disp: 100 tablet, Rfl: 1   latanoprost (XALATAN) 0.005 % ophthalmic solution, Place 1 drop into both eyes at bedtime.  , Disp: , Rfl:    levothyroxine  (SYNTHROID ) 150 MCG tablet, Take 1 tablet (150 mcg total) by mouth every morning on an empty stomach., Disp: 90 tablet, Rfl: 3   levothyroxine  (SYNTHROID ) 150 MCG tablet, Take 1 tablet (150 mcg total) by mouth every morning. Take on an empty stomach at least 30 mins before breakfast., Disp: 30 tablet, Rfl: 2   loperamide  (IMODIUM ) 2 MG capsule, Take 1 capsule (2 mg total) by mouth as needed for diarrhea or loose stools., Disp: , Rfl:    meloxicam  (MOBIC ) 15 MG tablet, Take 1 tablet (15 mg total) by mouth daily., Disp: 100 tablet, Rfl: 1   mirtazapine  (REMERON ) 7.5 MG tablet, Take 1 tablet (7.5 mg total) by mouth at bedtime., Disp: , Rfl:    Multiple Vitamins-Minerals (MULTIVITAL PO), Take 1 tablet by mouth daily., Disp: , Rfl:    oxyCODONE -acetaminophen  (PERCOCET) 10-325 MG tablet, Take 1 tablet by mouth every 6 (six) hours as needed., Disp: 120 tablet, Rfl: 0   oxyCODONE -acetaminophen  (PERCOCET/ROXICET) 5-325 MG tablet, Take 1 tablet by mouth every 12 (twelve) hours as needed for severe pain (pain score 7-10) (pain)., Disp: 30 tablet, Rfl: 0   senna-docusate (SENOKOT-S) 8.6-50 MG tablet, Take 2 tablets by mouth 2 (two) times daily between meals as needed for mild constipation., Disp: , Rfl:    simvastatin  (ZOCOR ) 20 MG tablet, Take 1 tablet (20 mg total) by mouth every evening., Disp: 100 tablet, Rfl: 1   simvastatin  (ZOCOR ) 20 MG tablet, Take 1 tablet (20 mg total) by mouth every evening., Disp: 30 tablet, Rfl: 2   TART CHERRY PO, Take 1 tablet by mouth daily., Disp: , Rfl:  [2]  Social History Tobacco Use   Smoking Status Never  Smokeless Tobacco Never  [3] No Known Allergies

## 2024-01-04 ENCOUNTER — Other Ambulatory Visit: Payer: Self-pay

## 2024-01-05 ENCOUNTER — Other Ambulatory Visit: Payer: Self-pay

## 2024-01-07 ENCOUNTER — Other Ambulatory Visit (HOSPITAL_COMMUNITY): Payer: Self-pay

## 2024-01-07 ENCOUNTER — Other Ambulatory Visit: Payer: Self-pay

## 2024-01-16 ENCOUNTER — Other Ambulatory Visit (HOSPITAL_COMMUNITY): Payer: Self-pay

## 2024-01-17 ENCOUNTER — Other Ambulatory Visit (HOSPITAL_COMMUNITY): Payer: Self-pay

## 2024-01-17 MED ORDER — ALPRAZOLAM 0.5 MG PO TABS
0.2500 mg | ORAL_TABLET | Freq: Three times a day (TID) | ORAL | 0 refills | Status: DC | PRN
  Filled 2024-01-17: qty 90, 30d supply, fill #0

## 2024-02-02 ENCOUNTER — Other Ambulatory Visit: Payer: Self-pay

## 2024-02-03 ENCOUNTER — Other Ambulatory Visit: Payer: Self-pay

## 2024-02-17 ENCOUNTER — Other Ambulatory Visit (HOSPITAL_COMMUNITY): Payer: Self-pay

## 2024-02-18 ENCOUNTER — Other Ambulatory Visit: Payer: Self-pay

## 2024-02-18 ENCOUNTER — Other Ambulatory Visit (HOSPITAL_COMMUNITY): Payer: Self-pay

## 2024-02-18 MED ORDER — ALPRAZOLAM 0.5 MG PO TABS
0.2500 mg | ORAL_TABLET | Freq: Three times a day (TID) | ORAL | 0 refills | Status: AC | PRN
  Filled 2024-02-18: qty 90, 30d supply, fill #0

## 2024-02-18 MED ORDER — OXYCODONE-ACETAMINOPHEN 10-325 MG PO TABS
1.0000 | ORAL_TABLET | Freq: Four times a day (QID) | ORAL | 0 refills | Status: AC | PRN
  Filled 2024-02-18: qty 120, 30d supply, fill #0
